# Patient Record
Sex: Female | Born: 1985
Health system: Southern US, Community
[De-identification: ages and names within clinical notes are randomized; demographics above are authoritative.]

## PROBLEM LIST (undated history)

## (undated) DIAGNOSIS — E039 Hypothyroidism, unspecified: Secondary | ICD-10-CM

## (undated) DIAGNOSIS — R7989 Other specified abnormal findings of blood chemistry: Secondary | ICD-10-CM

## (undated) HISTORY — PX: DILATION AND CURETTAGE OF UTERUS: SHX78

## (undated) HISTORY — DX: Other specified abnormal findings of blood chemistry: R79.89

## (undated) HISTORY — PX: WISDOM TOOTH EXTRACTION: SHX21

---
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
## 2009-07-27 LAB — HM PAP SMEAR

## 2011-12-31 ENCOUNTER — Ambulatory Visit (INDEPENDENT_AMBULATORY_CARE_PROVIDER_SITE_OTHER): Payer: 59 | Admitting: Family Medicine

## 2011-12-31 ENCOUNTER — Encounter: Payer: Self-pay | Admitting: Family Medicine

## 2011-12-31 VITALS — BP 132/72 | HR 67 | Temp 98.3°F | Ht 67.5 in | Wt 154.0 lb

## 2011-12-31 DIAGNOSIS — R6889 Other general symptoms and signs: Secondary | ICD-10-CM

## 2011-12-31 DIAGNOSIS — R7989 Other specified abnormal findings of blood chemistry: Secondary | ICD-10-CM

## 2011-12-31 DIAGNOSIS — K644 Residual hemorrhoidal skin tags: Secondary | ICD-10-CM

## 2011-12-31 LAB — TSH: TSH: 5.09 u[IU]/mL (ref 0.35–5.50)

## 2011-12-31 LAB — T4, FREE: Free T4: 0.82 ng/dL (ref 0.60–1.60)

## 2011-12-31 NOTE — Progress Notes (Signed)
New pt to est care.    Abnormal TSH on employment labs with no neck mass, dysphagia, h/o thyroid disease.  No meds.   Rectal bleed.  Occ and intermittent, bright red, small amount.  With painful BM.  Last episode about 2 weeks ago.  No personal or FH of GI cancer, UC, crohn's.  No NAV, no diarrhea, no weight loss, no rash.  Feeling well o/w.   PMH and SH reviewed  ROS: See HPI, otherwise noncontributory.  Meds, vitals, and allergies reviewed.  GEN: nad, alert and oriented HEENT: mucous membranes moist NECK: supple w/o LA, no goiter CV: rrr.  no murmur PULM: ctab, no inc wob ABD: soft, +bs EXT: no edema SKIN: no acute rash Small old ext hemorrhoid but no fissure noted, no gross blood.  Chaperoned exam.

## 2011-12-31 NOTE — Patient Instructions (Addendum)
Think about either setting up with gynecology or coming back for a pap smear at this clinic.   You can get your results through our phone system.  Follow the instructions on the blue card. I would work on increasing the fiber in your diet.  If you continue to have bleeding, call back and we'll set you up with GI.   Take care.

## 2012-01-02 ENCOUNTER — Encounter: Payer: Self-pay | Admitting: Family Medicine

## 2012-01-02 DIAGNOSIS — K644 Residual hemorrhoidal skin tags: Secondary | ICD-10-CM | POA: Insufficient documentation

## 2012-01-02 DIAGNOSIS — R7989 Other specified abnormal findings of blood chemistry: Secondary | ICD-10-CM | POA: Insufficient documentation

## 2012-01-02 NOTE — Assessment & Plan Note (Signed)
With repeat TSH on high end of normal 1/13, FT4 wnl, consider recheck yearly.

## 2012-01-02 NOTE — Assessment & Plan Note (Signed)
D/w pt.  No other obvious source.  She could have had a healed fissure.  Inc fiber and if persistent, we can refer over to GI. She agrees.

## 2012-01-04 ENCOUNTER — Encounter: Payer: Self-pay | Admitting: Family Medicine

## 2013-06-05 ENCOUNTER — Telehealth: Payer: Self-pay | Admitting: Family Medicine

## 2013-06-05 NOTE — Telephone Encounter (Signed)
Pt needs a CPE and an employment form signed prior to 07/04/2013 (she just rec'd it yesterday).  Due to her tight work schedule she is only available for CPE on 06/21/2013; 06/25/2013; or 06/28/2013.  However, your first available CPE appmt is not until the end of July.  Can you accommodate her an apptmt for CPE on one of the days she has requested? Thank you.

## 2013-06-05 NOTE — Telephone Encounter (Signed)
Scheduled for 06/28/2013

## 2013-06-05 NOTE — Telephone Encounter (Signed)
See if you can add her on either 6/26 or 7/3.  Thanks.

## 2013-06-28 ENCOUNTER — Ambulatory Visit (INDEPENDENT_AMBULATORY_CARE_PROVIDER_SITE_OTHER): Payer: 59 | Admitting: Family Medicine

## 2013-06-28 ENCOUNTER — Encounter: Payer: Self-pay | Admitting: Family Medicine

## 2013-06-28 VITALS — BP 104/64 | HR 72 | Temp 98.2°F | Ht 67.0 in | Wt 169.0 lb

## 2013-06-28 DIAGNOSIS — Z021 Encounter for pre-employment examination: Secondary | ICD-10-CM

## 2013-06-28 DIAGNOSIS — R7989 Other specified abnormal findings of blood chemistry: Secondary | ICD-10-CM

## 2013-06-28 DIAGNOSIS — Z0289 Encounter for other administrative examinations: Secondary | ICD-10-CM

## 2013-06-28 DIAGNOSIS — R6889 Other general symptoms and signs: Secondary | ICD-10-CM

## 2013-06-28 LAB — T4, FREE: Free T4: 0.79 ng/dL (ref 0.60–1.60)

## 2013-06-28 LAB — TSH: TSH: 3.4 u[IU]/mL (ref 0.35–5.50)

## 2013-06-28 NOTE — Patient Instructions (Addendum)
Go to the lab on the way out.  We'll contact you with your lab report. Take care.  Glad to see you.  

## 2013-06-28 NOTE — Progress Notes (Signed)
H/o mildly elevated TSH.  Discussed. No sx.  Due for recheck.    Recently with pap per gyn.  Tdap up to date.   Feels well.  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  See scanned forms.  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    Meds, vitals, and allergies reviewed.   ROS: See HPI.  Otherwise, noncontributory.  GEN: nad, alert and oriented HEENT: mucous membranes moist NECK: supple w/o LA, no TMG CV: rrr. PULM: ctab, no inc wob ABD: soft, +bs EXT: no edema SKIN: no acute rash

## 2013-07-01 DIAGNOSIS — Z021 Encounter for pre-employment examination: Secondary | ICD-10-CM | POA: Insufficient documentation

## 2013-07-01 NOTE — Assessment & Plan Note (Signed)
Recheck wnl, would likely not need f/u unless sx are present or pt has concerns.

## 2013-07-01 NOTE — Assessment & Plan Note (Signed)
No contraindication to work, see scanned forms.  Copy given to patient.

## 2013-07-30 ENCOUNTER — Encounter: Payer: Self-pay | Admitting: Family Medicine

## 2013-11-01 ENCOUNTER — Other Ambulatory Visit: Payer: Self-pay

## 2015-04-03 ENCOUNTER — Encounter: Payer: 59 | Admitting: Family Medicine

## 2015-04-07 ENCOUNTER — Telehealth: Payer: Self-pay | Admitting: Family Medicine

## 2015-04-07 NOTE — Telephone Encounter (Signed)
Scheduled for 4/20

## 2015-04-07 NOTE — Telephone Encounter (Signed)
Please, yes, thanks.  

## 2015-04-07 NOTE — Telephone Encounter (Signed)
Pt states she needs a tdap vaccination for school. Is this ok to schedule? Pts phone number is [phone]. Thanks.

## 2015-04-16 ENCOUNTER — Ambulatory Visit (INDEPENDENT_AMBULATORY_CARE_PROVIDER_SITE_OTHER): Payer: No Typology Code available for payment source | Admitting: *Deleted

## 2015-04-16 DIAGNOSIS — Z23 Encounter for immunization: Secondary | ICD-10-CM | POA: Diagnosis not present

## 2015-11-18 NOTE — Telephone Encounter (Signed)
Error

## 2016-09-13 DIAGNOSIS — Z23 Encounter for immunization: Secondary | ICD-10-CM | POA: Diagnosis not present

## 2017-02-14 DIAGNOSIS — Z01419 Encounter for gynecological examination (general) (routine) without abnormal findings: Secondary | ICD-10-CM | POA: Diagnosis not present

## 2017-02-14 DIAGNOSIS — Z6824 Body mass index (BMI) 24.0-24.9, adult: Secondary | ICD-10-CM | POA: Diagnosis not present

## 2017-03-16 DIAGNOSIS — D2271 Melanocytic nevi of right lower limb, including hip: Secondary | ICD-10-CM | POA: Diagnosis not present

## 2017-03-16 DIAGNOSIS — D2371 Other benign neoplasm of skin of right lower limb, including hip: Secondary | ICD-10-CM | POA: Diagnosis not present

## 2017-03-16 DIAGNOSIS — D485 Neoplasm of uncertain behavior of skin: Secondary | ICD-10-CM | POA: Diagnosis not present

## 2017-03-16 DIAGNOSIS — D225 Melanocytic nevi of trunk: Secondary | ICD-10-CM | POA: Diagnosis not present

## 2017-03-16 DIAGNOSIS — D2261 Melanocytic nevi of right upper limb, including shoulder: Secondary | ICD-10-CM | POA: Diagnosis not present

## 2018-04-10 ENCOUNTER — Encounter: Payer: Self-pay | Admitting: Family Medicine

## 2018-04-10 DIAGNOSIS — Z01419 Encounter for gynecological examination (general) (routine) without abnormal findings: Secondary | ICD-10-CM | POA: Diagnosis not present

## 2018-04-10 DIAGNOSIS — Z6824 Body mass index (BMI) 24.0-24.9, adult: Secondary | ICD-10-CM | POA: Diagnosis not present

## 2018-07-12 ENCOUNTER — Telehealth: Payer: Self-pay | Admitting: Family Medicine

## 2018-07-12 NOTE — Telephone Encounter (Signed)
Copied from [place] [id number]. Topic: Quick Communication - See Telephone Encounter >> Jul 12, 2018  2:39 PM Mylinda Latina, NT wrote: CRM for notification. See Telephone encounter for: 07/12/18. Patient called and states she is going out of the country and needs a Typhoid and malaria medication( for prevention )  and also she needs the Hep A vaccination. Please call if this need to be an appt or if this can be done. CB# [phone]

## 2018-07-12 NOTE — Telephone Encounter (Signed)
Pt had not been seen since 06/28/13; pt will need to reesstablish per Jeani Hawking with first 30' appt. Pt can only come week of 07/17/18. Pt plans to travel in 08/2018. Pt will bring immunization record with her. Pt scheduled 30' appt on 07/21/18 at 2:15. FYI to Dr Damita Dunnings.

## 2018-07-14 NOTE — Telephone Encounter (Signed)
Noted. Thanks.

## 2018-07-21 ENCOUNTER — Encounter: Payer: Self-pay | Admitting: Family Medicine

## 2018-07-21 ENCOUNTER — Ambulatory Visit (INDEPENDENT_AMBULATORY_CARE_PROVIDER_SITE_OTHER): Payer: BLUE CROSS/BLUE SHIELD | Admitting: Family Medicine

## 2018-07-21 VITALS — BP 108/68 | HR 68 | Temp 98.0°F | Ht 66.0 in | Wt 149.5 lb

## 2018-07-21 DIAGNOSIS — Z7189 Other specified counseling: Secondary | ICD-10-CM

## 2018-07-21 DIAGNOSIS — Z23 Encounter for immunization: Secondary | ICD-10-CM | POA: Diagnosis not present

## 2018-07-21 DIAGNOSIS — Z7184 Encounter for health counseling related to travel: Secondary | ICD-10-CM

## 2018-07-21 MED ORDER — TYPHOID VACCINE PO CPDR: 1.0000 | DELAYED_RELEASE_CAPSULE | ORAL | 0 refills | Status: DC

## 2018-07-21 MED ORDER — DOXYCYCLINE HYCLATE 100 MG PO TABS: ORAL_TABLET | ORAL | 0 refills | Status: DC

## 2018-07-21 NOTE — Progress Notes (Signed)
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   D/w pt.  She'll be working at [employer].    Husband Legrand Como designated if patient were incapacitated.    Pap done at [facility] 2019.   Requesting old records.    HIV screening prev done ~2008 and ~2014.    She is travelling to [place], [place] and [place].  Discussed with patient about routine travel cautions.  CDC information reviewed about destination specific cautions.  Reasonable to get hepatitis A vaccine started.  Typhoid vaccine is reasonable to do.  Malaria prophylaxis discussed.  Doxycycline is a reasonable options.  Routine cautions given.  Routine instructions given.  PMH and SH reviewed  ROS: Per HPI unless specifically indicated in ROS section   Meds, vitals, and allergies reviewed.   GEN: nad, alert and oriented HEENT: mucous membranes moist NECK: supple w/o LA CV: rrr. PULM: ctab, no inc wob ABD: soft, +bs EXT: no edema SKIN: no acute rash

## 2018-07-21 NOTE — Patient Instructions (Signed)
Go ahead and take typhoid vaccine.  Doxy prior to, during, and after the trip.  Sun caution.  HAV vaccine today. 2nd dose in 6 months.  Call for a nurse visit.  Take care.  Glad to see you.

## 2018-07-23 DIAGNOSIS — Z7189 Other specified counseling: Secondary | ICD-10-CM | POA: Insufficient documentation

## 2018-07-23 DIAGNOSIS — Z7184 Encounter for health counseling related to travel: Secondary | ICD-10-CM | POA: Insufficient documentation

## 2018-07-23 NOTE — Assessment & Plan Note (Signed)
Husband Legrand Como designated if patient were incapacitated.

## 2018-07-23 NOTE — Assessment & Plan Note (Addendum)
She is travelling to [place], [place] and [place].  Discussed with patient about routine travel cautions.  CDC information reviewed about destination specific cautions.  Reasonable to get hepatitis A vaccine started.  Typhoid vaccine is reasonable to do.  Malaria prophylaxis discussed.  Doxycycline is a reasonable options.  Routine cautions given.  Routine instructions given.  Return for second dose of hepatitis A vaccine in 6 months. >30 minutes spent in face to face time with patient, >50% spent in counselling or coordination of care.

## 2018-08-20 ENCOUNTER — Other Ambulatory Visit: Payer: Self-pay | Admitting: Family Medicine

## 2018-08-21 NOTE — Telephone Encounter (Signed)
Electronic refill request. Doxycycline Last office visit:   07/21/18 Last Filled:    50 tablet 0 07/21/2018  Please advise.

## 2018-08-22 NOTE — Telephone Encounter (Signed)
Spoke with grandmother, patient not available, who will ask patient to call the office.

## 2018-08-22 NOTE — Telephone Encounter (Signed)
Likely an autorefill request.  Please see what the issue is.  I didn't send yet.  Thanks.

## 2018-08-23 ENCOUNTER — Encounter: Payer: Self-pay | Admitting: Family Medicine

## 2018-08-23 NOTE — Telephone Encounter (Signed)
Spoke to patient and was advised that she did not request the refill because she does not need it.

## 2018-08-23 NOTE — Telephone Encounter (Signed)
Noted. Thanks.

## 2019-01-19 DIAGNOSIS — D225 Melanocytic nevi of trunk: Secondary | ICD-10-CM | POA: Diagnosis not present

## 2019-01-19 DIAGNOSIS — L905 Scar conditions and fibrosis of skin: Secondary | ICD-10-CM | POA: Diagnosis not present

## 2019-01-19 DIAGNOSIS — D485 Neoplasm of uncertain behavior of skin: Secondary | ICD-10-CM | POA: Diagnosis not present

## 2019-01-19 DIAGNOSIS — D2271 Melanocytic nevi of right lower limb, including hip: Secondary | ICD-10-CM | POA: Diagnosis not present

## 2019-01-19 DIAGNOSIS — D2239 Melanocytic nevi of other parts of face: Secondary | ICD-10-CM | POA: Diagnosis not present

## 2019-01-19 DIAGNOSIS — D2261 Melanocytic nevi of right upper limb, including shoulder: Secondary | ICD-10-CM | POA: Diagnosis not present

## 2019-03-06 DIAGNOSIS — D485 Neoplasm of uncertain behavior of skin: Secondary | ICD-10-CM | POA: Diagnosis not present

## 2019-03-06 DIAGNOSIS — L905 Scar conditions and fibrosis of skin: Secondary | ICD-10-CM | POA: Diagnosis not present

## 2019-04-03 DIAGNOSIS — M9903 Segmental and somatic dysfunction of lumbar region: Secondary | ICD-10-CM | POA: Diagnosis not present

## 2019-04-03 DIAGNOSIS — M5386 Other specified dorsopathies, lumbar region: Secondary | ICD-10-CM | POA: Diagnosis not present

## 2019-04-03 DIAGNOSIS — M9902 Segmental and somatic dysfunction of thoracic region: Secondary | ICD-10-CM | POA: Diagnosis not present

## 2019-04-03 DIAGNOSIS — M9905 Segmental and somatic dysfunction of pelvic region: Secondary | ICD-10-CM | POA: Diagnosis not present

## 2019-04-10 DIAGNOSIS — M5386 Other specified dorsopathies, lumbar region: Secondary | ICD-10-CM | POA: Diagnosis not present

## 2019-04-10 DIAGNOSIS — M9905 Segmental and somatic dysfunction of pelvic region: Secondary | ICD-10-CM | POA: Diagnosis not present

## 2019-04-10 DIAGNOSIS — M9902 Segmental and somatic dysfunction of thoracic region: Secondary | ICD-10-CM | POA: Diagnosis not present

## 2019-04-10 DIAGNOSIS — M9903 Segmental and somatic dysfunction of lumbar region: Secondary | ICD-10-CM | POA: Diagnosis not present

## 2019-04-17 DIAGNOSIS — M9903 Segmental and somatic dysfunction of lumbar region: Secondary | ICD-10-CM | POA: Diagnosis not present

## 2019-04-17 DIAGNOSIS — M5386 Other specified dorsopathies, lumbar region: Secondary | ICD-10-CM | POA: Diagnosis not present

## 2019-04-17 DIAGNOSIS — M9902 Segmental and somatic dysfunction of thoracic region: Secondary | ICD-10-CM | POA: Diagnosis not present

## 2019-04-17 DIAGNOSIS — M9905 Segmental and somatic dysfunction of pelvic region: Secondary | ICD-10-CM | POA: Diagnosis not present

## 2019-04-24 DIAGNOSIS — M9905 Segmental and somatic dysfunction of pelvic region: Secondary | ICD-10-CM | POA: Diagnosis not present

## 2019-04-24 DIAGNOSIS — M9903 Segmental and somatic dysfunction of lumbar region: Secondary | ICD-10-CM | POA: Diagnosis not present

## 2019-04-24 DIAGNOSIS — M9902 Segmental and somatic dysfunction of thoracic region: Secondary | ICD-10-CM | POA: Diagnosis not present

## 2019-04-24 DIAGNOSIS — M5386 Other specified dorsopathies, lumbar region: Secondary | ICD-10-CM | POA: Diagnosis not present

## 2019-05-01 DIAGNOSIS — M5386 Other specified dorsopathies, lumbar region: Secondary | ICD-10-CM | POA: Diagnosis not present

## 2019-05-01 DIAGNOSIS — M9905 Segmental and somatic dysfunction of pelvic region: Secondary | ICD-10-CM | POA: Diagnosis not present

## 2019-05-01 DIAGNOSIS — M9903 Segmental and somatic dysfunction of lumbar region: Secondary | ICD-10-CM | POA: Diagnosis not present

## 2019-05-01 DIAGNOSIS — M9902 Segmental and somatic dysfunction of thoracic region: Secondary | ICD-10-CM | POA: Diagnosis not present

## 2019-05-07 DIAGNOSIS — M9903 Segmental and somatic dysfunction of lumbar region: Secondary | ICD-10-CM | POA: Diagnosis not present

## 2019-05-07 DIAGNOSIS — M5386 Other specified dorsopathies, lumbar region: Secondary | ICD-10-CM | POA: Diagnosis not present

## 2019-05-07 DIAGNOSIS — M9905 Segmental and somatic dysfunction of pelvic region: Secondary | ICD-10-CM | POA: Diagnosis not present

## 2019-05-07 DIAGNOSIS — M9902 Segmental and somatic dysfunction of thoracic region: Secondary | ICD-10-CM | POA: Diagnosis not present

## 2019-05-23 DIAGNOSIS — Z03818 Encounter for observation for suspected exposure to other biological agents ruled out: Secondary | ICD-10-CM | POA: Diagnosis not present

## 2019-06-07 DIAGNOSIS — Z319 Encounter for procreative management, unspecified: Secondary | ICD-10-CM | POA: Diagnosis not present

## 2019-06-07 DIAGNOSIS — Z6824 Body mass index (BMI) 24.0-24.9, adult: Secondary | ICD-10-CM | POA: Diagnosis not present

## 2019-06-07 DIAGNOSIS — Z01419 Encounter for gynecological examination (general) (routine) without abnormal findings: Secondary | ICD-10-CM | POA: Diagnosis not present

## 2019-08-01 DIAGNOSIS — D485 Neoplasm of uncertain behavior of skin: Secondary | ICD-10-CM | POA: Diagnosis not present

## 2019-08-01 DIAGNOSIS — D225 Melanocytic nevi of trunk: Secondary | ICD-10-CM | POA: Diagnosis not present

## 2019-08-01 DIAGNOSIS — D235 Other benign neoplasm of skin of trunk: Secondary | ICD-10-CM | POA: Diagnosis not present

## 2019-08-17 DIAGNOSIS — N911 Secondary amenorrhea: Secondary | ICD-10-CM | POA: Diagnosis not present

## 2019-08-17 DIAGNOSIS — Z3201 Encounter for pregnancy test, result positive: Secondary | ICD-10-CM | POA: Diagnosis not present

## 2019-08-17 DIAGNOSIS — Z368A Encounter for antenatal screening for other genetic defects: Secondary | ICD-10-CM | POA: Diagnosis not present

## 2019-08-28 DIAGNOSIS — O021 Missed abortion: Secondary | ICD-10-CM | POA: Diagnosis not present

## 2019-08-28 DIAGNOSIS — Z3A08 8 weeks gestation of pregnancy: Secondary | ICD-10-CM | POA: Diagnosis not present

## 2019-08-28 DIAGNOSIS — O26891 Other specified pregnancy related conditions, first trimester: Secondary | ICD-10-CM | POA: Diagnosis not present

## 2019-09-06 DIAGNOSIS — O021 Missed abortion: Secondary | ICD-10-CM | POA: Diagnosis not present

## 2019-09-06 DIAGNOSIS — O039 Complete or unspecified spontaneous abortion without complication: Secondary | ICD-10-CM | POA: Diagnosis not present

## 2020-03-27 ENCOUNTER — Telehealth: Payer: Self-pay | Admitting: Radiology

## 2020-03-27 ENCOUNTER — Ambulatory Visit (INDEPENDENT_AMBULATORY_CARE_PROVIDER_SITE_OTHER): Payer: BC Managed Care – PPO | Admitting: Internal Medicine

## 2020-03-27 ENCOUNTER — Ambulatory Visit (HOSPITAL_COMMUNITY)
Admission: RE | Admit: 2020-03-27 | Discharge: 2020-03-27 | Disposition: A | Discharge status: Home / Self Care | Payer: BC Managed Care – PPO | Source: Ambulatory Visit | Attending: Internal Medicine | Admitting: Internal Medicine

## 2020-03-27 ENCOUNTER — Encounter: Payer: Self-pay | Admitting: Internal Medicine

## 2020-03-27 ENCOUNTER — Other Ambulatory Visit: Payer: Self-pay

## 2020-03-27 ENCOUNTER — Other Ambulatory Visit (HOSPITAL_COMMUNITY): Payer: Self-pay | Admitting: *Deleted

## 2020-03-27 DIAGNOSIS — I493 Ventricular premature depolarization: Secondary | ICD-10-CM | POA: Diagnosis not present

## 2020-03-27 DIAGNOSIS — R002 Palpitations: Secondary | ICD-10-CM

## 2020-03-27 DIAGNOSIS — I340 Nonrheumatic mitral (valve) insufficiency: Secondary | ICD-10-CM | POA: Insufficient documentation

## 2020-03-27 MED ORDER — METOPROLOL SUCCINATE ER 25 MG PO TB24: 25.0000 mg | ORAL_TABLET | Freq: Every day | ORAL | 11 refills | Status: DC

## 2020-03-27 NOTE — Progress Notes (Signed)
HPI [name] is referred today by Sharyne Peach for evaluation of PVC's. 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 She has had 5 days of intermittent palpitations. She has not had syncope. She denies chest pain or sob. She consumes 2 ETOH drinks though not daily and 2 cups of coffee a day. She has had no edema. 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   Not on File   Current Outpatient Medications  Medication Sig Dispense Refill  . metoprolol succinate (TOPROL XL) 25 MG 24 hr tablet Take 1 tablet (25 mg total) by mouth daily. 30 tablet 11   No current facility-administered medications for this visit.     History reviewed. No pertinent past medical history.  ROS:   All systems reviewed and negative except as noted in the HPI.   History reviewed. No pertinent surgical history.   History reviewed. No pertinent family history.   Social History   Socioeconomic History  . Marital status: Married    Spouse name: Not on file  . Number of children: Not on file  . Years of education: Not on file  . Highest education level: Not on file  Occupational History  . Not on file  Tobacco Use  . Smoking status: Not on file  Substance and Sexual Activity  . Alcohol use: Not on file  . Drug use: Not on file  . Sexual activity: Not on file  Other Topics Concern  . Not on file  Social History Narrative  . Not on file   Social Determinants of Health   Financial Resource Strain:   . Difficulty of Paying Living Expenses:   Food Insecurity:   . Worried About Charity fundraiser in the Last Year:   . Arboriculturist in the Last Year:   Transportation Needs:   . Film/video editor (Medical):   Marland Kitchen Lack of Transportation (Non-Medical):   Physical Activity:   . Days of Exercise per Week:   . Minutes of Exercise per Session:   Stress:   . Feeling of  Stress :   Social Connections:   . Frequency of Communication with Friends and Family:   . Frequency of Social Gatherings with Friends and Family:   . Attends Religious Services:   . Active Member of Clubs or Organizations:   . Attends Archivist Meetings:   Marland Kitchen Marital Status:   Intimate Partner Violence:   . Fear of Current or Ex-Partner:   . Emotionally Abused:   Marland Kitchen Physically Abused:   . Sexually Abused:      BP 108/64   Pulse 62   Physical Exam:  Well appearing 34 yo woman, NAD HEENT: Unremarkable Neck:  No 6 cm no thyromegally Lymphatics:  No adenopathy Back:  No CVA tenderness Lungs:  Clear with no wheezes HEART:  IRegular rate rhythm, no murmurs, no rubs, no clicks Abd:  soft, positive bowel sounds, no organomegally, no rebound, no guarding Ext:  2 plus pulses, no edema, no cyanosis, no clubbing Skin:  No rashes no nodules Neuro:  CN II through XII intact, motor grossly intact  EKG - nsr with occaisional PVC's, monomorphic  Assess/Plan: 1. 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 I have discussed the treatment options with the patient.  I have encouraged her to reduce her caffeine/ETOH intake. She has had a 2D echo which is pending and listed under her maiden name Melody Gibbs. I have recommended she undergo a 3 day Zio patch monitor to determine her overall burden and start taking toprol 25 mg daily. Additonal medical therapy and evaluation will depend on the results of the 2D echo and Zio patch monitor.   Melody Gibbs.D.

## 2020-03-27 NOTE — Progress Notes (Signed)
  Echocardiogram 2D Echocardiogram has been performed.  Melody Gibbs 03/27/2020, 2:40 PM

## 2020-03-27 NOTE — Progress Notes (Signed)
EKG and echo order placed per Dr Haroldine Laws, test sch for 2 pm today then pt will see Dr Lovena Le at [facility] afterwards. Pt is aware

## 2020-03-27 NOTE — Telephone Encounter (Signed)
Enrolled patient for a 3 day Zio monitor to be mailed to patients home.  

## 2020-03-27 NOTE — Addendum Note (Signed)
Addended by: Scarlette Calico on: 03/27/2020 12:08 PM   Modules accepted: Orders

## 2020-03-27 NOTE — Addendum Note (Signed)
Addended by: Scarlette Calico on: 03/27/2020 01:34 PM   Modules accepted: Orders

## 2020-03-27 NOTE — Patient Instructions (Addendum)
Medication Instructions:  Your physician has recommended you make the following change in your medication:   1.  Start taking Toprol XL 25 mg-  Take one tablet by mouth daily   Labwork: None ordered.  Testing/Procedures: None ordered.  Follow-Up:  Any Other Special Instructions Will Be Listed Below (If Applicable).  If you need a refill on your cardiac medications before your next appointment, please call your pharmacy.   ZIO XT- Long Term Monitor Instructions   Your physician has requested you wear your ZIO patch monitor_3___days.   This is a single patch monitor.  [employer] supplies one patch monitor per enrollment.  Additional stickers are not available.   Please do not apply patch if you will be having a Nuclear Stress Test, Echocardiogram, Cardiac CT, MRI, or Chest Xray during the time frame you would be wearing the monitor. The patch cannot be worn during these tests.  You cannot remove and re-apply the ZIO XT patch monitor.   Your ZIO patch monitor will be sent USPS Priority mail from [facility] directly to your home address. The monitor may also be mailed to a PO BOX if home delivery is not available.   It may take 3-5 days to receive your monitor after you have been enrolled.   Once you have received you monitor, please review enclosed instructions.  Your monitor has already been registered assigning a specific monitor serial # to you.   Applying the monitor   Shave hair from upper left chest.   Hold abrader disc by orange tab.  Rub abrader in 40 strokes over left upper chest as indicated in your monitor instructions.   Clean area with 4 enclosed alcohol pads .  Use all pads to assure are is cleaned thoroughly.  Let dry.   Apply patch as indicated in monitor instructions.  Patch will be place under collarbone on left side of chest with arrow pointing upward.   Rub patch adhesive wings for 2 minutes.Remove white label marked "1".  Remove white label marked "2".   Rub patch adhesive wings for 2 additional minutes.   While looking in a mirror, press and release button in center of patch.  A small green light will flash 3-4 times .  This will be your only indicator the monitor has been turned on.     Do not shower for the first 24 hours.  You may shower after the first 24 hours.   Press button if you feel a symptom. You will hear a small click.  Record Date, Time and Symptom in the Patient Log Book.   When you are ready to remove patch, follow instructions on last 2 pages of Patient Log Book.  Stick patch monitor onto last page of Patient Log Book.   Place Patient Log Book in [place] box.  Use locking tab on box and tape box closed securely.  The [employer] and [employer] has [employer] on it.  Please place in mailbox as soon as possible.  Your physician should have your test results approximately 7 days after the monitor has been mailed back to [facility].   Call [facility] at [phone] if you have questions regarding your ZIO XT patch monitor.  Call them immediately if you see an orange light blinking on your monitor.   If your monitor falls off in less than 4 days contact our Monitor department at [phone].  If your monitor becomes loose or falls off after 4 days call [employer] at  [phone] for suggestions on securing your monitor.

## 2020-03-28 ENCOUNTER — Encounter: Payer: Self-pay | Admitting: Family Medicine

## 2020-03-28 ENCOUNTER — Telehealth: Payer: Self-pay

## 2020-03-28 DIAGNOSIS — I493 Ventricular premature depolarization: Secondary | ICD-10-CM

## 2020-03-28 NOTE — Telephone Encounter (Addendum)
Order placed for lab work per Dr. Lovena Le BMP, mag and TSH

## 2020-03-28 NOTE — Addendum Note (Signed)
Addended by: Willeen Cass A on: 03/28/2020 03:04 PM   Modules accepted: Orders

## 2020-03-31 ENCOUNTER — Other Ambulatory Visit (INDEPENDENT_AMBULATORY_CARE_PROVIDER_SITE_OTHER): Payer: BC Managed Care – PPO

## 2020-03-31 DIAGNOSIS — R002 Palpitations: Secondary | ICD-10-CM | POA: Diagnosis not present

## 2020-03-31 DIAGNOSIS — I493 Ventricular premature depolarization: Secondary | ICD-10-CM

## 2020-04-01 ENCOUNTER — Other Ambulatory Visit: Payer: BC Managed Care – PPO

## 2020-04-01 ENCOUNTER — Other Ambulatory Visit: Payer: Self-pay

## 2020-04-01 DIAGNOSIS — I493 Ventricular premature depolarization: Secondary | ICD-10-CM

## 2020-04-02 ENCOUNTER — Ambulatory Visit: Payer: BC Managed Care – PPO | Admitting: Cardiovascular Disease

## 2020-04-02 ENCOUNTER — Ambulatory Visit: Payer: BLUE CROSS/BLUE SHIELD | Admitting: Cardiovascular Disease

## 2020-04-02 LAB — BASIC METABOLIC PANEL WITH GFR
BUN/Creatinine Ratio: 17 (ref 9–23)
BUN: 13 mg/dL (ref 6–20)
CO2: 23 mmol/L (ref 20–29)
Calcium: 9.2 mg/dL (ref 8.7–10.2)
Chloride: 103 mmol/L (ref 96–106)
Creatinine, Ser: 0.75 mg/dL (ref 0.57–1.00)
GFR calc Af Amer: 122 mL/min/1.73
GFR calc non Af Amer: 106 mL/min/1.73
Glucose: 66 mg/dL (ref 65–99)
Potassium: 4.3 mmol/L (ref 3.5–5.2)
Sodium: 141 mmol/L (ref 134–144)

## 2020-04-02 LAB — MAGNESIUM: Magnesium: 2.1 mg/dL (ref 1.6–2.3)

## 2020-04-02 LAB — TSH: TSH: 4.38 u[IU]/mL (ref 0.450–4.500)

## 2020-07-09 ENCOUNTER — Ambulatory Visit (INDEPENDENT_AMBULATORY_CARE_PROVIDER_SITE_OTHER): Payer: BC Managed Care – PPO | Admitting: Family Medicine

## 2020-07-09 ENCOUNTER — Other Ambulatory Visit: Payer: Self-pay

## 2020-07-09 DIAGNOSIS — M999 Biomechanical lesion, unspecified: Secondary | ICD-10-CM | POA: Diagnosis not present

## 2020-07-09 DIAGNOSIS — M533 Sacrococcygeal disorders, not elsewhere classified: Secondary | ICD-10-CM

## 2020-07-09 MED ORDER — TIZANIDINE HCL 4 MG PO TABS: 4.0000 mg | ORAL_TABLET | Freq: Every day | ORAL | 0 refills | Status: DC

## 2020-07-09 MED ORDER — MELOXICAM 15 MG PO TABS: 15.0000 mg | ORAL_TABLET | Freq: Every day | ORAL | 0 refills | Status: DC

## 2020-07-09 NOTE — Patient Instructions (Addendum)
Exercises 3x a week Meloxicam Zanaflex See me again in 5 weeks

## 2020-07-09 NOTE — Progress Notes (Signed)
[facility] [place] Phone: [phone] Subjective:   Melody Gibbs, am serving as a scribe for Dr. Hulan Saas. This visit occurred during the SARS-CoV-2 public health emergency.  Safety protocols were in place, including screening questions prior to the visit, additional usage of staff PPE, and extensive cleaning of exam room while observing appropriate contact time as indicated for disinfecting solutions.   I'm seeing this patient by the request  of:  Tonia Ghent, MD  CC: Low back pain  AST:[id number]  Melody Gibbs is a 34 y.o. female coming in with complaint of lower back pain for 10 years. Is active and certain exercises bother her back ie v-ups, kettle bell swings. Can on occasion have heaviness in her right leg. On bad days she will rotate Tylenol and IBU.  Patient was a gymnast at one point.  Patient has remained very active.  Feels though that she has more fallout from aggressive working out now than she used to.  Denies any fevers chills or any abnormal weight loss     Past Medical History:  Diagnosis Date   Abnormal TSH    5.9 in 2012   Gibbs past surgical history on file. 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 Frequent exercise Enjoys photography Married 2019    Social Determinants of  Health   Financial Resource Strain:    Difficulty of Paying Living Expenses:   Food Insecurity:    Worried About Charity fundraiser in the Last Year:    Arboriculturist in the Last Year:   Transportation Needs:    Film/video editor (Medical):    Lack of Transportation (Non-Medical):   Physical Activity:    Days of Exercise per Week:    Minutes of Exercise per Session:   Stress:    Feeling of Stress :   Social Connections:    Frequency of Communication with Friends and Family:    Frequency of Social Gatherings with Friends and Family:    Attends Religious Services:    Active Member of Clubs or Organizations:    Attends Music therapist:    Marital Status:    Allergies  Allergen Reactions   Penicillins     As a child   Family History  Problem Relation Age of Onset   Hypertension Father    Hypertension Other    Hyperlipidemia Other    Arthritis Other    Dementia Other    Colon cancer Neg Hx    Breast cancer Neg Hx      Current Outpatient Medications (Cardiovascular):    metoprolol succinate (TOPROL XL) 25 MG 24 hr tablet, Take 1 tablet (25 mg  total) by mouth daily.   Current Outpatient Medications (Analgesics):    meloxicam (MOBIC) 15 MG tablet, Take 1 tablet (15 mg total) by mouth daily.   Current Outpatient Medications (Other):    doxycycline (VIBRA-TABS) 100 MG tablet, Start 3 days prior to travel.  Take 1 day.  Sun caution.  Take for 4 weeks after trip.   typhoid (VIVOTIF) DR capsule, Take 1 capsule by mouth every other day.   tiZANidine (ZANAFLEX) 4 MG tablet, Take 1 tablet (4 mg total) by mouth at bedtime.   Reviewed prior external information including notes and imaging from  primary care provider As well as notes that were available from care everywhere and other healthcare systems.  Past medical history, social, surgical and family history all reviewed in electronic medical record.  Gibbs pertanent information  unless stated regarding to the chief complaint.   Review of Systems:  Gibbs headache, visual changes, nausea, vomiting, diarrhea, constipation, dizziness, abdominal pain, skin rash, fevers, chills, night sweats, weight loss, swollen lymph nodes, body aches, joint swelling, chest pain, shortness of breath, mood changes. POSITIVE muscle aches  Objective  Blood pressure 102/64, pulse 70, height 5\' 6"  (1.676 m), weight 155 lb (70.3 kg), SpO2 97 %.   General: Gibbs apparent distress alert and oriented x3 mood and affect normal, dressed appropriately.  HEENT: Pupils equal, extraocular movements intact  Respiratory: Patient's speak in full sentences and does not appear short of breath  Cardiovascular: Gibbs lower extremity edema, non tender, Gibbs erythema  Neuro: Cranial nerves II through XII are intact, neurovascularly intact in all extremities with 2+ DTRs and 2+ pulses.  Gait normal with good balance and coordination.  MSK: Low back exam shows the patient does have tenderness to palpation over the right sacroiliac joint.  Positive Corky Sox.  Tender to palpation.  Negative straight leg test.  Mild tightness noted of the right hamstring comparatively tightness also in the hip flexor on the right more than left  Osteopathic findings  T8 extended rotated and side bent right L3 flexed rotated and side bent right Sacrum right on right     Impression and Recommendations:     The above documentation has been reviewed and is accurate and complete Melody Pulley, DO       Note: This dictation was prepared with Dragon dictation along with smaller phrase technology. Any transcriptional errors that result from this process are unintentional.

## 2020-07-10 ENCOUNTER — Encounter: Payer: Self-pay | Admitting: Family Medicine

## 2020-07-10 DIAGNOSIS — M999 Biomechanical lesion, unspecified: Secondary | ICD-10-CM | POA: Insufficient documentation

## 2020-07-10 DIAGNOSIS — M533 Sacrococcygeal disorders, not elsewhere classified: Secondary | ICD-10-CM | POA: Insufficient documentation

## 2020-07-10 NOTE — Assessment & Plan Note (Signed)

## 2020-07-10 NOTE — Assessment & Plan Note (Signed)
Right-sided.  Seems to be significant.  Patient has had this intermittently for some time.  Discussed the potential for x-rays in the near future.  X-ray was down today so we will hold.  Zanaflex meloxicam given.  Responded well to manipulation.  Follow-up again 6 to 8 weeks

## 2020-08-06 ENCOUNTER — Other Ambulatory Visit: Payer: Self-pay | Admitting: Family Medicine

## 2020-08-19 ENCOUNTER — Ambulatory Visit: Payer: BC Managed Care – PPO | Admitting: Family Medicine

## 2020-08-21 ENCOUNTER — Other Ambulatory Visit: Payer: Self-pay | Admitting: Family Medicine

## 2020-08-25 NOTE — Progress Notes (Signed)
[facility] [address] [place] Phone: [phone] Subjective:   I Melody Gibbs am serving as a Education administrator for Dr. Hulan Saas.  This visit occurred during the SARS-CoV-2 public health emergency.  Safety protocols were in place, including screening questions prior to the visit, additional usage of staff PPE, and extensive cleaning of exam room while observing appropriate contact time as indicated for disinfecting solutions.   I'm seeing this patient by the request  of:  Tonia Ghent, MD  CC: back pain follow up   DVV:[id number]  Armanie Ullmer is a 34 y.o. female coming in with complaint of back pain. OMT 07/09/2020. Patient states she is doing well. Making progress well, lots of decrease in pain an dincrease activity, hit or miss on hep   Medications patient has been prescribed: 10 day of meloxicma very helpful   Taking: meloxicam and zanaflex          Reviewed prior external information including notes and imaging from previsou exam, outside providers and external EMR if available.   As well as notes that were available from care everywhere and other healthcare systems.  Past medical history, social, surgical and family history all reviewed in electronic medical record.  No pertanent information unless stated regarding to the chief complaint.   Past Medical History:  Diagnosis Date  . Abnormal TSH    5.9 in 2012    Allergies  Allergen Reactions  . Penicillins     As a child     Review of Systems:  No headache, visual changes, nausea, vomiting, diarrhea, constipation, dizziness, abdominal pain, skin rash, fevers, chills, night sweats, weight loss, swollen lymph nodes, joint swelling, chest pain, shortness of breath, mood changes. POSITIVE muscle aches, body aches   Objective  Blood pressure 100/70, pulse 78, height 5\' 6"  (1.676 m), weight 153 lb (69.4 kg), SpO2 98 %.   General: No apparent distress alert and oriented  x3 mood and affect normal, dressed appropriately.  HEENT: Pupils equal, extraocular movements intact  Respiratory: Patient's speak in full sentences and does not appear short of breath  Cardiovascular: No lower extremity edema, non tender, no erythema  Neuro: Cranial nerves II through XII are intact, neurovascularly intact in all extremities with 2+ DTRs and 2+ pulses.  Gait normal with good balance and coordination.  MSK:  Non tender with full range of motion and good stability and symmetric strength and tone of shoulders, elbows, wrist, hip, knee and ankles bilaterally.  Back - mild tightness in the TL and si joint right great er then left. + faber, TTP right SI joint, negSLT   Osteopathic findings  T6 E RS right  L1 flexed rotated and side bent right Sacrum right on right       Assessment and Plan:  SI (sacroiliac) joint dysfunction Improvement noted, HEP encouraged, discussed burst of meloxicam and zanaflex rtc in 2 month     Nonallopathic problems  Decision today to treat with OMT was based on Physical Exam  After verbal consent patient was treated with HVLA, ME, FPR techniques in cervical, rib, thoracic, lumbar, and sacral  areas  Patient tolerated the procedure well with improvement in symptoms  Patient given exercises, stretches and lifestyle modifications  See medications in patient instructions if given  Patient will follow up in 8 weeks      The above documentation has been reviewed and is accurate and complete Lyndal Pulley, DO  Note: This dictation was prepared with Dragon dictation along with smaller phrase technology. Any transcriptional errors that result from this process are unintentional.

## 2020-08-26 ENCOUNTER — Encounter: Payer: Self-pay | Admitting: Family Medicine

## 2020-08-26 ENCOUNTER — Ambulatory Visit (INDEPENDENT_AMBULATORY_CARE_PROVIDER_SITE_OTHER): Payer: BC Managed Care – PPO | Admitting: Family Medicine

## 2020-08-26 ENCOUNTER — Other Ambulatory Visit: Payer: Self-pay

## 2020-08-26 DIAGNOSIS — M999 Biomechanical lesion, unspecified: Secondary | ICD-10-CM

## 2020-08-26 DIAGNOSIS — M533 Sacrococcygeal disorders, not elsewhere classified: Secondary | ICD-10-CM

## 2020-08-26 NOTE — Patient Instructions (Addendum)
Good to see you Use muscle relaxer and meloxicam in burst when needed Vitamin D 2000 daily Keep up everything else  See me again in 2 months

## 2020-08-27 ENCOUNTER — Encounter: Payer: Self-pay | Admitting: Family Medicine

## 2020-08-27 NOTE — Assessment & Plan Note (Signed)
Improvement noted, HEP encouraged, discussed burst of meloxicam and zanaflex rtc in 2 month

## 2020-10-28 ENCOUNTER — Ambulatory Visit: Payer: BC Managed Care – PPO | Admitting: Family Medicine

## 2020-12-16 LAB — OB RESULTS CONSOLE HIV ANTIBODY (ROUTINE TESTING): HIV: NONREACTIVE

## 2020-12-16 LAB — OB RESULTS CONSOLE RUBELLA ANTIBODY, IGM: Rubella: IMMUNE

## 2020-12-16 LAB — OB RESULTS CONSOLE HEPATITIS B SURFACE ANTIGEN: Hepatitis B Surface Ag: NEGATIVE

## 2020-12-16 LAB — OB RESULTS CONSOLE RPR: RPR: NONREACTIVE

## 2020-12-16 LAB — OB RESULTS CONSOLE GBS: GBS: NEGATIVE

## 2020-12-16 LAB — OB RESULTS CONSOLE GC/CHLAMYDIA
Chlamydia: NEGATIVE
Gonorrhea: NEGATIVE

## 2020-12-16 LAB — OB RESULTS CONSOLE VARICELLA ZOSTER ANTIBODY, IGG: Varicella: IMMUNE

## 2020-12-19 ENCOUNTER — Other Ambulatory Visit: Payer: Self-pay

## 2020-12-29 ENCOUNTER — Encounter: Payer: Self-pay | Admitting: Genetic Counselor

## 2020-12-31 ENCOUNTER — Encounter: Payer: Self-pay | Admitting: Genetic Counselor

## 2021-01-22 ENCOUNTER — Telehealth: Payer: Self-pay

## 2021-01-22 NOTE — Telephone Encounter (Signed)
Patient's records have been indexed in her chart. Appointment is ready to be scheduled once she calls back.

## 2021-01-22 NOTE — Telephone Encounter (Signed)
Called patient, no answer, left message to return call to Maternal Fetal Medicine. Stated received referral from Dr. Cheri Fowler.

## 2021-01-26 ENCOUNTER — Other Ambulatory Visit: Payer: Self-pay

## 2021-01-26 ENCOUNTER — Ambulatory Visit
Payer: No Typology Code available for payment source | Attending: Obstetrics and Gynecology | Admitting: Genetic Counselor

## 2021-01-26 DIAGNOSIS — O352XX0 Maternal care for (suspected) hereditary disease in fetus, not applicable or unspecified: Secondary | ICD-10-CM

## 2021-01-26 DIAGNOSIS — Z31430 Encounter of female for testing for genetic disease carrier status for procreative management: Secondary | ICD-10-CM

## 2021-01-26 NOTE — Progress Notes (Signed)
I spoke with Melody Gibbs during a genetic counseling consultation with her husband, Melody Gibbs, on 12/29/20. Melody Gibbs was initially referred for genetic counseling to determine his carrier Gibbs for Melody Gibbs, as his father and sister are both known carriers. Given that Melody Gibbs is currently pregnant, we discussed the option of carrier screening for Melody Gibbs only versus Melody Gibbs plus additional conditions for a more comprehensive risk assessment for the current fetus. The following excerpt is copied from [place] note from 12/29/20:  "We reviewed that carrier screening for Melody Gibbs is recommended for Melody Gibbs. Melody Gibbs indicated that he would like to pursue carrier screening. We discussed that if he were found to be a carrier, carrier screening would be recommended for his wife in order to refine the chances of the couple having an affected fetus. We discussed that if Melody Gibbs is found to be a carrier and Melody Gibbs carrier screening is negative, the couple's children would all have a 50% chance of being unaffected carriers for Melody Gibbs.  Melody Gibbs expressed that he is content with only having carrier screening for Melody Gibbs performed. However, his wife is potentially interested in pursuing more comprehensive carrier screening for the sake of gaining more knowledge for the current and future pregnancies. We discussed that expanded carrier screening(ECS)isa testing option that evaluates an individual's carrier Gibbs fora wide range of genetic conditions. ECS panels can include>200autosomal recessive or X-linked genetic conditions. Inthe event that an individualisfound to be a carrier for one or more recessive conditions, carrier screening is recommend for their partner for those conditions. This can help determine if the current and future pregnancies are at increased risk for a particular X-linked or recessive genetic condition aside from Melody  Gibbs.    We discussed that one option is for Melody Gibbs to be screened for WRN only and to test his wife for that gene if his results are positive. Another option would be to pursue ECS for either Melody Gibbs or his wife in addition to Melody Gibbs carrier screening. ECS is available at any time. The couple expressed that they may potentially be interested in ECS depending on cost considerations; however, WRN carrier screening takes priority."  After a benefits investigation to determine expected out of pocket costs for the couple's testing options, they elected to begin by assessing Melody Gibbs for Melody Gibbs. 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 The following excerpt is from a telephone note from 01/16/21 in which Melody Gibbs test results were reviewed with the couple:  "We discussed that Melody Gibbs results came back positive, meaning he is a carrier for the same pathogenic variant in the Melody Gibbs gene as his sister and father. We discussed that carrier screening for this gene is recommended for his wife Melody Gibbs to determine the couple's chance of having a child with Melody Gibbs. Melody Gibbs was also present on this phone call.  I informed the couple that per a billing representative from [facility], carrier screening for the WRN gene is expected to cost $100 through [employer]. During our previous genetic counseling session, Melody Gibbs had expressed interested in more expanded carrier screening (ECS). ECS was estimated to cost $0 through [employer] through a prior benefits investigation. We discussed that if carrier screening for the WRN gene and the 289 other genes [facility]'s ECS option offers is desired, we would need to place two different orders and Melody Gibbs would need to provide two samples. This is  because WRN is not included on [facility]'s Comprehensive Carrier Screen. If she were found to  be a carrier for any other recessive condition on ECS aside from Melody Gibbs, testing Melody Gibbs for those condition(s) would be recommended and would cost $100 out of pocket.  Melody Gibbs elected to proceed with WRN carrier screening and [facility]'s Comprehensive Carrier Screening. However, she and her husband are currently in [place] until the end of the month. We made a plan for me to place the order for Melody Gibbs's tests on 01/26/21. The laboratory will mail two saliva kits to the couple's house for her. I will call her the day I place the order."  Melody Gibbs contacted me via MyChart yesterday, 01/25/21, to confirm that I had all of the information needed to place the order for WRN carrier screening and Comprehensive Carrier Screening. I confirmed that I had the necessary information and will place the orders for her testing today. The lab will mail Melody Gibbs two saliva kits, one for WRN gene testing and the other for Comprehensive Carrier Screening. Results from both tests will take ~2-3 weeks to be returned. I will call Melody Gibbs once these results become available. I encouraged her to contact me should she have any questions in the meantime.  Melody Manis, MS, [facility] Genetic Counselor

## 2021-02-04 ENCOUNTER — Ambulatory Visit: Payer: Self-pay | Admitting: Family Medicine

## 2021-02-15 ENCOUNTER — Other Ambulatory Visit: Payer: Self-pay

## 2021-02-16 ENCOUNTER — Telehealth: Payer: Self-pay | Admitting: Genetic Counselor

## 2021-02-16 NOTE — Telephone Encounter (Signed)
LVM for Ms. Mcloughlin regarding her carrier screening results for the WRN gene. Ms. Shorey was negative for Michael's variant and other pathogenic variants in the WRN gene, significantly reducing her chances of being a carrier for Werner syndrome. I informed her that these results significantly decrease the chance for Ms. Sollers and her husband to have a child with Werner syndrome.  Results from Ms. Steers's expanded carrier screening for 289 other genes is still pending. I indicated that these results will likely be returned some time this week or next week. I will call her once her results become available. I encouraged Ms. Rann to contact me should she or her husband have any questions about her WRN results before then.  Buelah Manis, MS, [facility] Genetic Counselor

## 2021-03-02 ENCOUNTER — Other Ambulatory Visit: Payer: Self-pay | Admitting: Obstetrics and Gynecology

## 2021-03-02 ENCOUNTER — Other Ambulatory Visit: Payer: Self-pay

## 2021-03-02 DIAGNOSIS — O283 Abnormal ultrasonic finding on antenatal screening of mother: Secondary | ICD-10-CM

## 2021-03-02 DIAGNOSIS — Z3A20 20 weeks gestation of pregnancy: Secondary | ICD-10-CM

## 2021-03-02 DIAGNOSIS — Z363 Encounter for antenatal screening for malformations: Secondary | ICD-10-CM

## 2021-03-03 ENCOUNTER — Ambulatory Visit: Payer: No Typology Code available for payment source | Admitting: *Deleted

## 2021-03-03 ENCOUNTER — Encounter: Payer: Self-pay | Admitting: *Deleted

## 2021-03-03 ENCOUNTER — Other Ambulatory Visit: Payer: Self-pay

## 2021-03-03 ENCOUNTER — Ambulatory Visit: Payer: No Typology Code available for payment source | Attending: Obstetrics and Gynecology

## 2021-03-03 VITALS — BP 116/60 | HR 83

## 2021-03-03 DIAGNOSIS — O283 Abnormal ultrasonic finding on antenatal screening of mother: Secondary | ICD-10-CM | POA: Diagnosis present

## 2021-03-03 DIAGNOSIS — Z363 Encounter for antenatal screening for malformations: Secondary | ICD-10-CM | POA: Insufficient documentation

## 2021-03-03 DIAGNOSIS — Z3A20 20 weeks gestation of pregnancy: Secondary | ICD-10-CM | POA: Insufficient documentation

## 2021-03-04 ENCOUNTER — Other Ambulatory Visit: Payer: Self-pay | Admitting: *Deleted

## 2021-03-04 ENCOUNTER — Telehealth: Payer: Self-pay | Admitting: Genetic Counselor

## 2021-03-04 DIAGNOSIS — O283 Abnormal ultrasonic finding on antenatal screening of mother: Secondary | ICD-10-CM

## 2021-03-04 NOTE — Telephone Encounter (Signed)
I left a voicemail for Ms. Melichar to discuss her negative expanded carrier screening (ECS) results. Specifically, Ms. Kehres had ECS performed through [facility]. Ms. Atilano was negative for pathogenic variants analyzed in 289 genes associated with autosomal recessive and X-linked disorders (listed separately in the laboratory report). I informed Ms. Wolfer that these negative results mean that her risk to be a carrier for these conditions has been reduced, but not fully eliminated. This also significantly reduces the couple's risk of having a child affected by one of these conditions. I reminded Ms. Holben that this test does not assess for all possible genetic conditions.   Given that Ms. Zeiger's results were negative, it is not necessary to perform carrier screening for her husband for any of the 289 conditions that Ms. Reen was tested for at this time. I encouraged Ms. Schwieger to call me or send me a MyChart message if she has any questions.  Buelah Manis, MS, [facility] Genetic Counselor

## 2021-03-13 ENCOUNTER — Encounter: Payer: Self-pay | Admitting: Family Medicine

## 2021-03-13 ENCOUNTER — Ambulatory Visit (INDEPENDENT_AMBULATORY_CARE_PROVIDER_SITE_OTHER): Payer: No Typology Code available for payment source | Admitting: Family Medicine

## 2021-03-13 ENCOUNTER — Other Ambulatory Visit: Payer: Self-pay

## 2021-03-13 VITALS — BP 112/80 | HR 64 | Temp 98.1°F | Resp 17 | Ht 67.0 in | Wt 169.2 lb

## 2021-03-13 DIAGNOSIS — I493 Ventricular premature depolarization: Secondary | ICD-10-CM

## 2021-03-13 DIAGNOSIS — O358XX0 Maternal care for other (suspected) fetal abnormality and damage, not applicable or unspecified: Secondary | ICD-10-CM | POA: Diagnosis not present

## 2021-03-13 DIAGNOSIS — Z1283 Encounter for screening for malignant neoplasm of skin: Secondary | ICD-10-CM

## 2021-03-13 DIAGNOSIS — Z636 Dependent relative needing care at home: Secondary | ICD-10-CM | POA: Insufficient documentation

## 2021-03-13 DIAGNOSIS — O35BXX0 Maternal care for other (suspected) fetal abnormality and damage, fetal cardiac anomalies, not applicable or unspecified: Secondary | ICD-10-CM

## 2021-03-13 NOTE — Patient Instructions (Signed)
Follow up in 1 year or as needed No need for labs today- yay!!! Continue to work on stress management- you've got a lot on your plate!!! Call with any questions or concerns Stay Safe!  Stay Healthy! Welcome!  We're glad to have you!!

## 2021-03-13 NOTE — Assessment & Plan Note (Addendum)
New to provider, ongoing for pt.  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  Grandmother fell again last night and broke her hip.  Hospice is now involved.  Pt reports it is a lot to deal with but she feels well supported by her husband.  Encouraged her to take advantage of Hospice resources for family members.

## 2021-03-13 NOTE — Assessment & Plan Note (Addendum)
Pt is currently asymptomatic but there is a chance of recurrence due to pregnancy and increased stress recently.  She has cut out caffeine.  She is actively working on Child psychotherapist and mindfulness.  She is to let me know if she again develops symptoms.

## 2021-03-13 NOTE — Progress Notes (Signed)
   Subjective:    Patient ID: Melody Gibbs, female    DOB: Aug 22, 1986, 35 y.o.   MRN: [id number]  HPI New to establish.  Recently moved to the area [place]) from [facility]  Pregnant- 21.5 weeks.  First baby.  Following w/ Dr Willis Modena.  Anatomy scan showed possible pulmonary mass.  MFM could not find evidence of pulmonary mass.  Fetal heart has L axis and she is being referred to Peds Cards at [facility].  Pt reports she is sleeping well, continues to exercise.  Pt is understandably anxious regarding the possibility of congenital cardiac defects.  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  Grandmother fell last night and broke her hip.  Hospice is now involved.  They are working on transitioning to Hospice house  PVCs- pt saw EP last year and was prescribed Metoprolol.  She took it for a couple of days and then stopped.  'I used it as like a re-set'.  PVCs were/are stress related.  Has cut out caffeine.  No CP, SOB, edema    Review of Systems For ROS see HPI   This visit occurred during the SARS-CoV-2 public health emergency.  Safety protocols were in place, including screening questions prior to the visit, additional usage of staff PPE, and extensive cleaning of exam room while observing appropriate contact time as indicated for disinfecting solutions.       Objective:   Physical Exam Vitals reviewed.  Constitutional:      General: She is not in acute distress.    Appearance: Normal appearance. She is well-developed.  HENT:     Head: Normocephalic and atraumatic.  Eyes:     Conjunctiva/sclera: Conjunctivae normal.     Pupils: Pupils are equal, round, and reactive to light.  Neck:     Thyroid: No thyromegaly.  Cardiovascular:     Rate and Rhythm: Normal rate and regular rhythm.     Heart sounds: Normal heart sounds. No murmur heard.   Pulmonary:     Effort: Pulmonary effort is normal. No respiratory distress.     Breath sounds: Normal  breath sounds.  Abdominal:     Comments: gravid  Musculoskeletal:     Cervical back: Normal range of motion and neck supple.     Right lower leg: No edema.     Left lower leg: No edema.  Lymphadenopathy:     Cervical: No cervical adenopathy.  Skin:    General: Skin is warm and dry.  Neurological:     Mental Status: She is alert and oriented to person, place, and time.  Psychiatric:        Behavior: Behavior normal.           Assessment & Plan:  Pregnancy complicated by possible cardiovascular abnormality- pt has had a roller coaster of emotions the last few weeks when anatomy scan indicated possible pulmonary mass/sequestration and they were referred to MFM.  MFM did not see any pulmonary issues but noticed heart had strange L axis.  They have upcoming appt w/ Peds Cardiology.  Encouraged her to reach out if things became too overwhelming.  Will follow along.

## 2021-03-14 IMAGING — US US MFM OB DETAIL+14 WK
1 series · 12 of 28 positions shown · non-contrast
Comparison: none

[Series 1: us mfm ob detail+14 wk · 198 acquisitions, 12 frames shown]
[im 8/198]
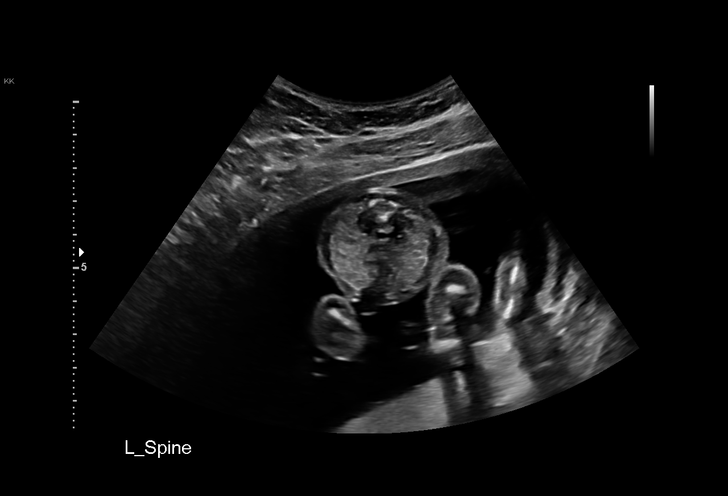
[im 22/198]
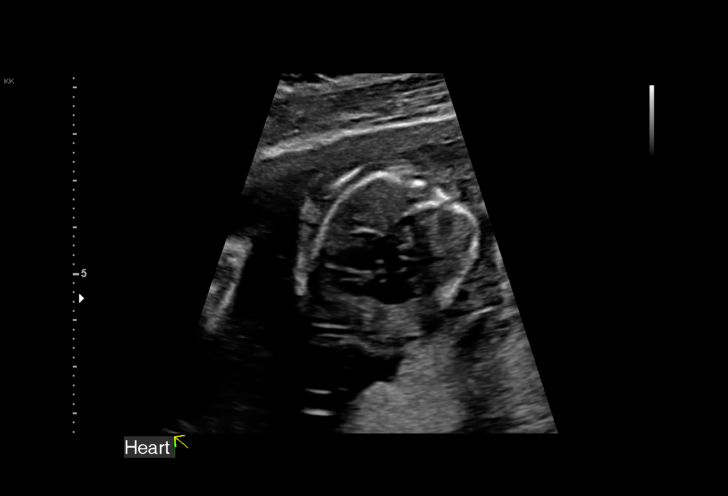
[im 37/198]
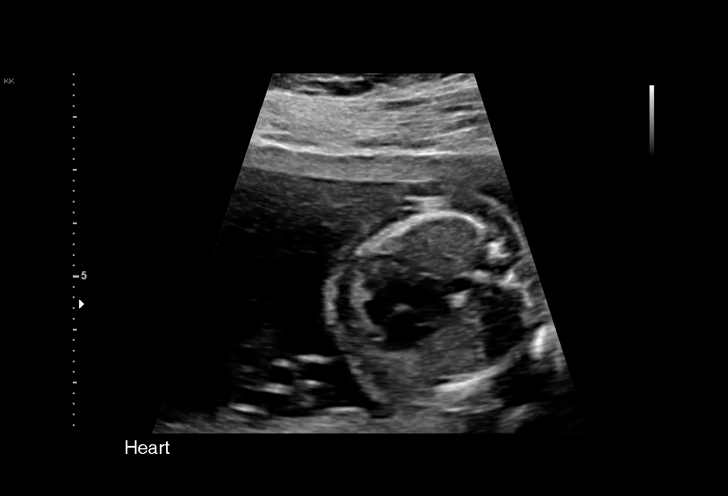
[im 59/198]
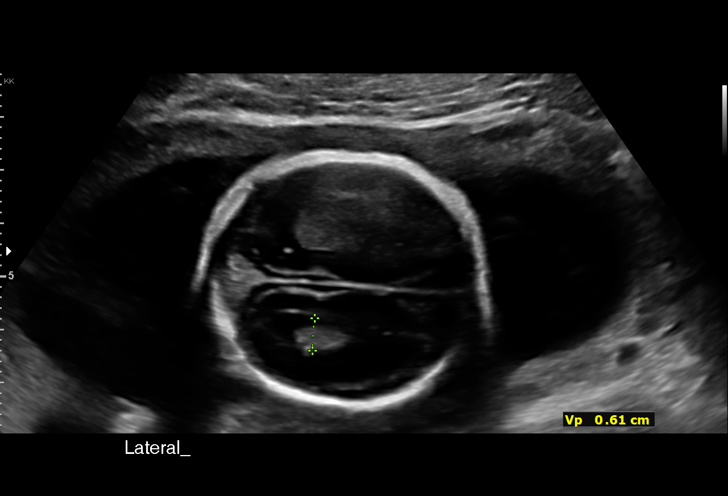
[im 73/198]
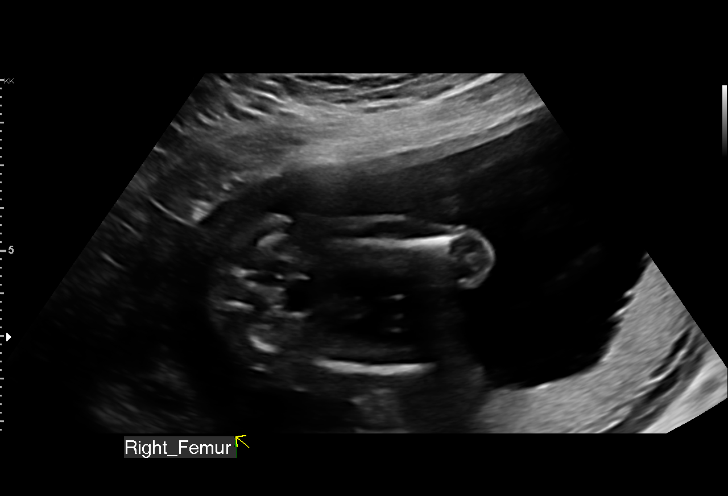
[im 88/198]
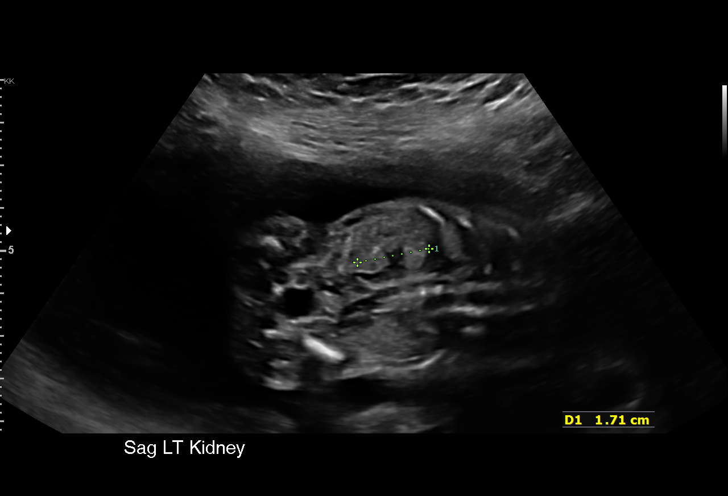
[im 110/198]
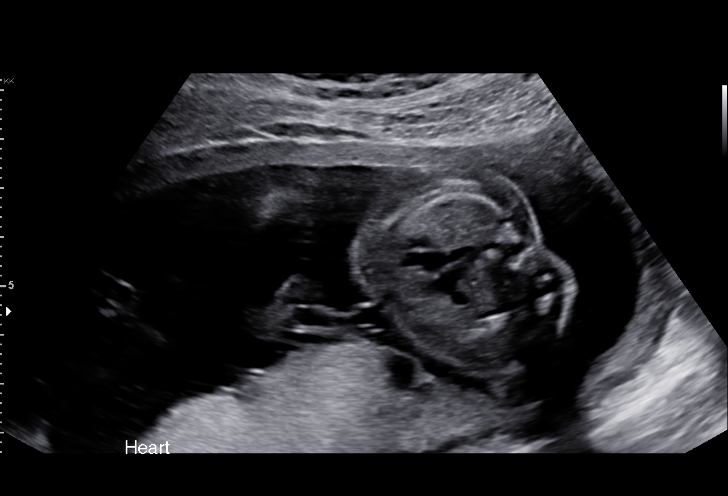
[im 125/198]
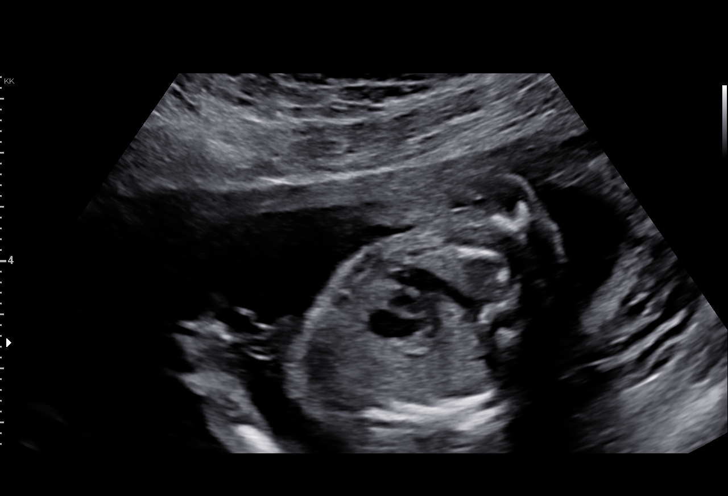
[im 139/198]
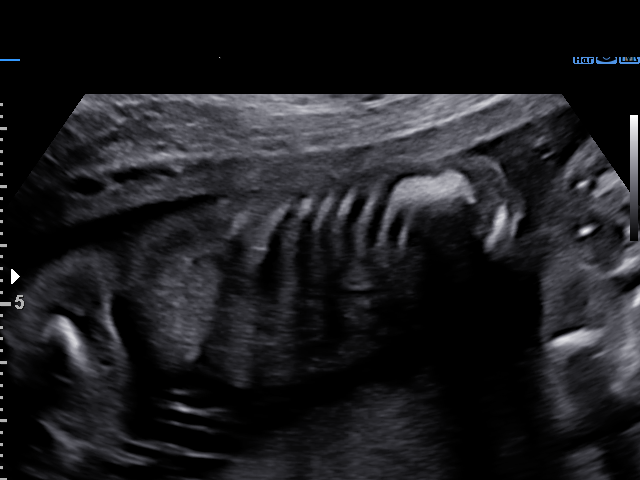
[im 161/198]
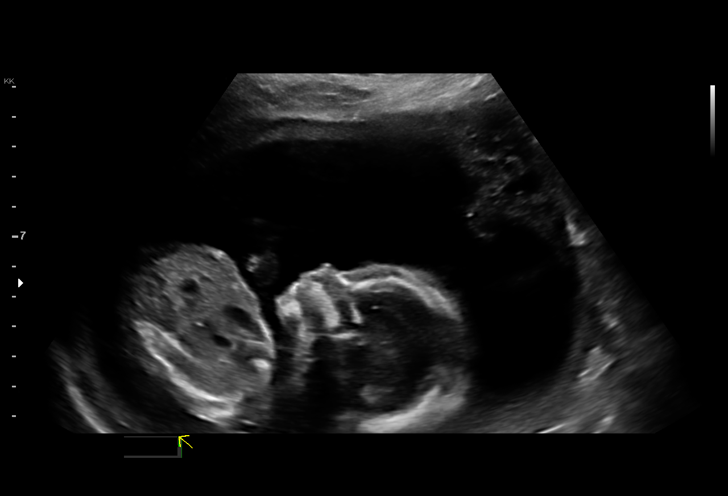
[im 176/198]
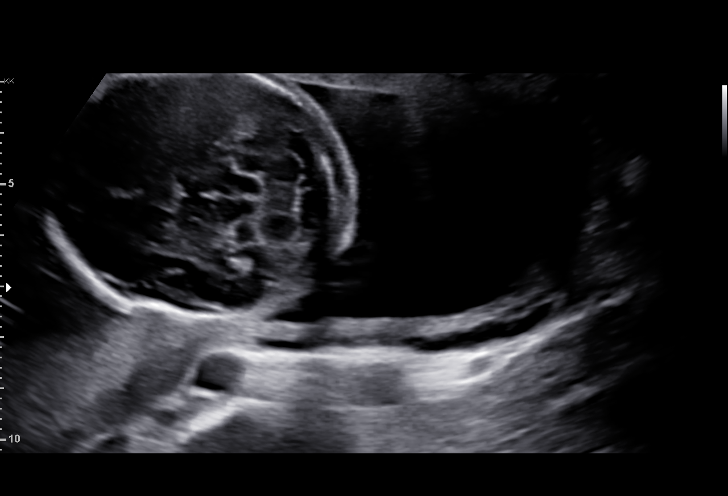
[im 190/198]
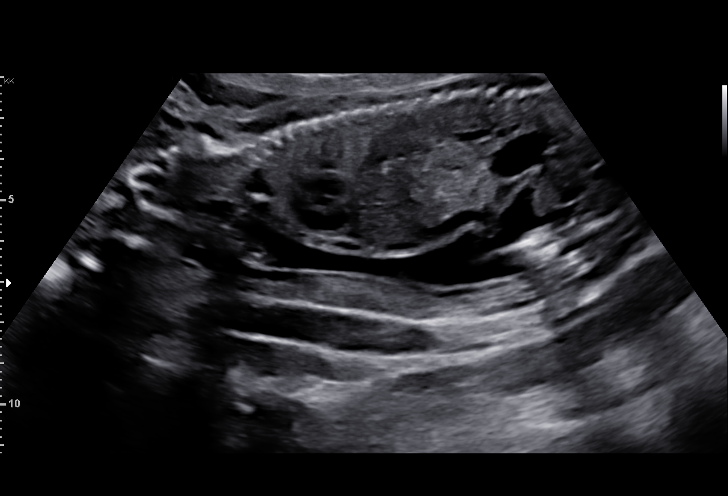

[12 of 28 positions shown; findings below may reference images not displayed]

[HOSPITAL],
                                                            Inc.

Indications

 Fetal abnormality - other known or
 suspected (abnormal axis of heart)
 Encounter for antenatal screening for
 malformations
 Negative [facility]
 20 weeks gestation of pregnancy
Fetal Evaluation

 Num Of Fetuses:         1
 Fetal Heart Rate(bpm):  157
 Cardiac Activity:       Observed
 Presentation:           Variable
 Placenta:               Posterior
 P. Cord Insertion:      Visualized

 Amniotic Fluid
 AFI FV:      Within normal limits

                             Largest Pocket(cm)

Biometry
 BPD:      50.2  mm     G. Age:  21w 1d         90  %    CI:        77.05   %    70 - 86
                                                         FL/HC:      16.9   %    16.8 -
 HC:      181.1  mm     G. Age:  20w 4d         67  %    HC/AC:      1.25        1.09 -
 AC:      144.9  mm     G. Age:  19w 6d         38  %    FL/BPD:     61.0   %
 FL:       30.6  mm     G. Age:  19w 3d         24  %    FL/AC:      21.1   %    20 - 24
 HUM:      30.6  mm     G. Age:  20w 1d         57  %
 CER:      20.3  mm     G. Age:  19w 4d         57  %
 NFT:       3.7  mm

 LV:        6.1  mm
 CM:        3.3  mm

 Est. FW:     313  gm    0 lb 11 oz      33  %
OB History

 Gravidity:    1         Term:   0        Prem:   0        SAB:   0
 TOP:          0       Ectopic:  0        Living: 0
Gestational Age

 LMP:           20w 0d        Date:  10/14/20                 EDD:   07/21/21
 U/S Today:     20w 2d                                        EDD:   07/19/21
 Best:          20w 0d     Det. By:  LMP  (10/14/20)          EDD:   07/21/21
Anatomy

 Cranium:               Appears normal         Aortic Arch:            Not well visualized
 Cavum:                 Appears normal         Ductal Arch:            Not well visualized
 Ventricles:            Appears normal         Diaphragm:              Appears normal
 Choroid Plexus:        Appears normal         Stomach:                Appears normal, left
                                                                       sided
 Cerebellum:            Appears normal         Abdomen:                Appears normal
 Posterior Fossa:       Appears normal         Abdominal Wall:         Appears nml (cord
                                                                       insert, abd wall)
 Nuchal Fold:           Appears normal         Cord Vessels:           Appears normal (3
                                                                       vessel cord)
 Face:                  Appears normal         Kidneys:                Appear normal
                        (orbits and profile)
 Lips:                  Appears normal         Bladder:                Appears normal
 Thoracic:              Appears normal         Spine:                  Appears normal
 Heart:                 Abnormal, see          Upper Extremities:      Appears normal
                        comments
 RVOT:                  Appears normal         Lower Extremities:      Appears normal
 LVOT:                  Appears normal

 Other:  Parents do not wish to know sex of fetus.
Cervix Uterus Adnexa

 Cervix
 Length:           3.55  cm.
 Normal appearance by transabdominal scan.

 Uterus
 Intrauterine synechiae seen.
 Adnexa
 No abnormality visualized.
Comments

 This patient was seen for a detailed fetal anatomy scan as a
 possible fetal chest mass and the abnormal cardiac axis were
 noted on ultrasound performed in your office.  Her husband
 has a family history of Yoselyn syndrome.  The patient has
 already met with our genetic counselor regarding the
 transmission of Yoselyn syndrome to her fetus.
 She denies any significant past medical history and denies
 any problems in her current pregnancy.
 She had a cell free DNA test earlier in her pregnancy which
 indicated a low risk for trisomy 21, 18, and 13.
 She was informed that the fetal growth and amniotic fluid
 level were appropriate for her gestational age.
 On today's exam, the fetal cardiac axis (approximately 70
 degrees) appeared abnormal.  She was advised that the
 normal cardiac axis varies between 45 to 60 degrees.  An
 abnormal cardiac axis may indicate an increased risk of a
 congenital heart defect or a mass in the chest that may be
 displacing the heart.
 Based on the cardiac views that were visualized today, I do
 not suspect that a congenital heart defect is present.  I do not
 believe that a chest mass is present.  The fetal stomach
 appeared to be in the abdomen.
 The patient was advised that hopefully, the abnormal cardiac
 axis is just a normal variant.  The limitations of ultrasound in
 the detection of all anomalies was discussed.
 The patient was informed that anomalies may be missed due
 to technical limitations. If the fetus is in a suboptimal position
 or maternal habitus is increased, visualization of the fetus in
 the maternal uterus may be impaired.
 Due to the abnormal fetal cardiac axis noted today, she was
 offered and declined an amniocentesis for definitive
 diagnosis of fetal aneuploidy.
 Due to the abnormal fetal cardiac axis noted today, a referral
 was made for a fetal echocardiogram with [facility]
 cardiology.
 The availability of a fetal MRI for detection of a chest mass
 was discussed.  She would prefer not to have the exam at
 this time.  She will consider having the MRI based on the
 findings from her fetal echocardiogram.
 A follow-up exam was scheduled in our office in 4 weeks.

## 2021-03-23 ENCOUNTER — Encounter: Payer: Self-pay | Admitting: Pediatric Cardiology

## 2021-04-02 ENCOUNTER — Other Ambulatory Visit: Payer: Self-pay

## 2021-04-02 ENCOUNTER — Encounter: Payer: Self-pay | Admitting: *Deleted

## 2021-04-02 ENCOUNTER — Ambulatory Visit: Payer: No Typology Code available for payment source | Admitting: *Deleted

## 2021-04-02 ENCOUNTER — Ambulatory Visit: Payer: No Typology Code available for payment source | Attending: Obstetrics

## 2021-04-02 ENCOUNTER — Other Ambulatory Visit: Payer: Self-pay | Admitting: *Deleted

## 2021-04-02 VITALS — BP 115/64 | HR 61

## 2021-04-02 DIAGNOSIS — O283 Abnormal ultrasonic finding on antenatal screening of mother: Secondary | ICD-10-CM | POA: Insufficient documentation

## 2021-04-02 DIAGNOSIS — Z3A24 24 weeks gestation of pregnancy: Secondary | ICD-10-CM

## 2021-04-02 DIAGNOSIS — O359XX0 Maternal care for (suspected) fetal abnormality and damage, unspecified, not applicable or unspecified: Secondary | ICD-10-CM | POA: Diagnosis not present

## 2021-04-02 DIAGNOSIS — Z3689 Encounter for other specified antenatal screening: Secondary | ICD-10-CM

## 2021-06-11 ENCOUNTER — Other Ambulatory Visit: Payer: Self-pay

## 2021-06-11 ENCOUNTER — Encounter: Payer: Self-pay | Admitting: *Deleted

## 2021-06-11 ENCOUNTER — Other Ambulatory Visit: Payer: Self-pay | Admitting: *Deleted

## 2021-06-11 ENCOUNTER — Ambulatory Visit: Payer: No Typology Code available for payment source | Admitting: *Deleted

## 2021-06-11 ENCOUNTER — Ambulatory Visit: Payer: No Typology Code available for payment source | Attending: Obstetrics

## 2021-06-11 VITALS — BP 102/62 | HR 66

## 2021-06-11 DIAGNOSIS — O283 Abnormal ultrasonic finding on antenatal screening of mother: Secondary | ICD-10-CM | POA: Insufficient documentation

## 2021-06-11 DIAGNOSIS — Z3689 Encounter for other specified antenatal screening: Secondary | ICD-10-CM | POA: Diagnosis present

## 2021-06-11 DIAGNOSIS — O3663X0 Maternal care for excessive fetal growth, third trimester, not applicable or unspecified: Secondary | ICD-10-CM

## 2021-06-11 DIAGNOSIS — O359XX0 Maternal care for (suspected) fetal abnormality and damage, unspecified, not applicable or unspecified: Secondary | ICD-10-CM | POA: Diagnosis not present

## 2021-06-11 DIAGNOSIS — Z3A34 34 weeks gestation of pregnancy: Secondary | ICD-10-CM | POA: Diagnosis not present

## 2021-06-22 IMAGING — US US MFM OB FOLLOW-UP
1 series · 13 of 22 positions shown · non-contrast
Comparison: none

[Series 1: us mfm ob follow-up · 22 acquisitions, 13 frames shown]
[im 1/22]
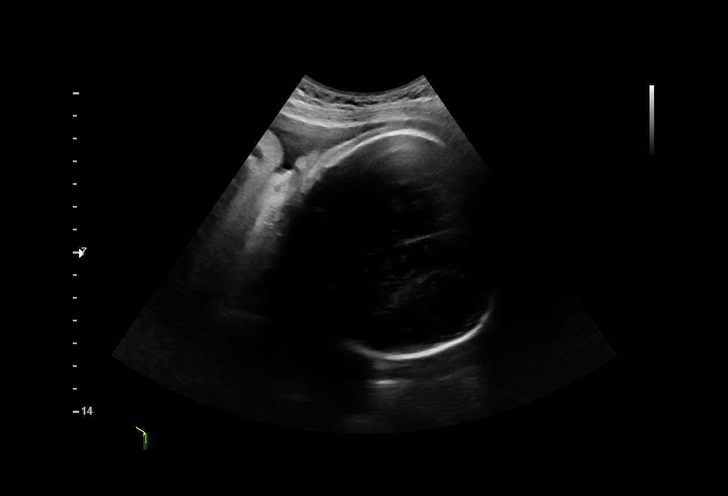
[im 3/22]
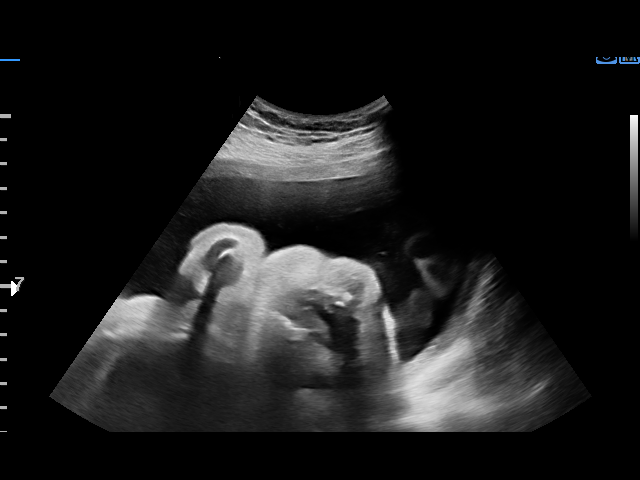
[im 5/22]
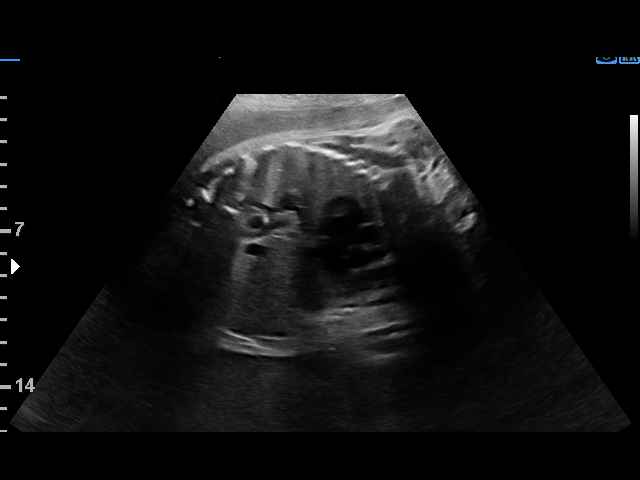
[im 6/22]
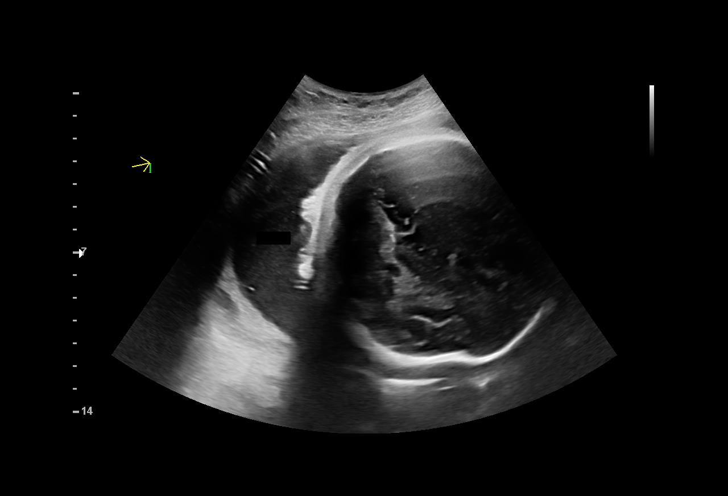
[im 8/22]
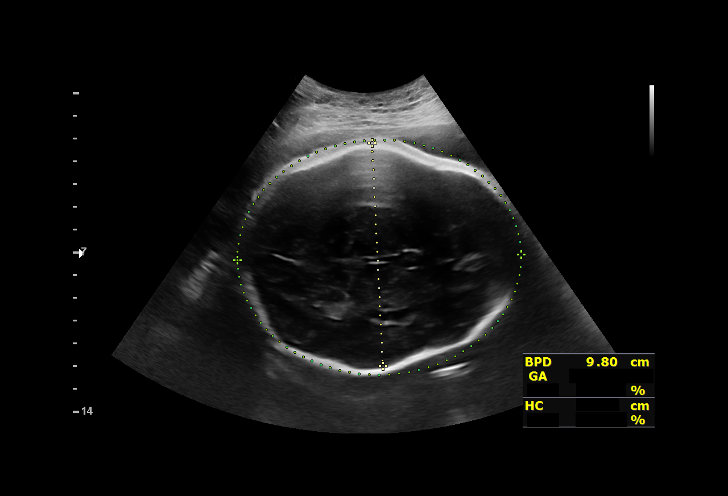
[im 10/22]
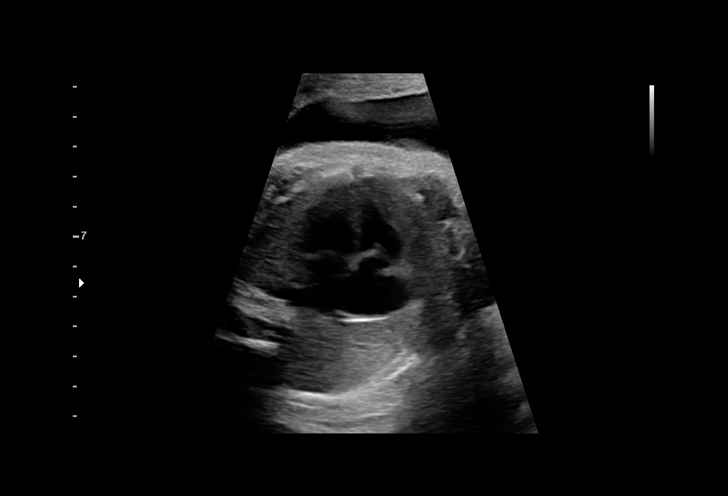
[im 12/22]
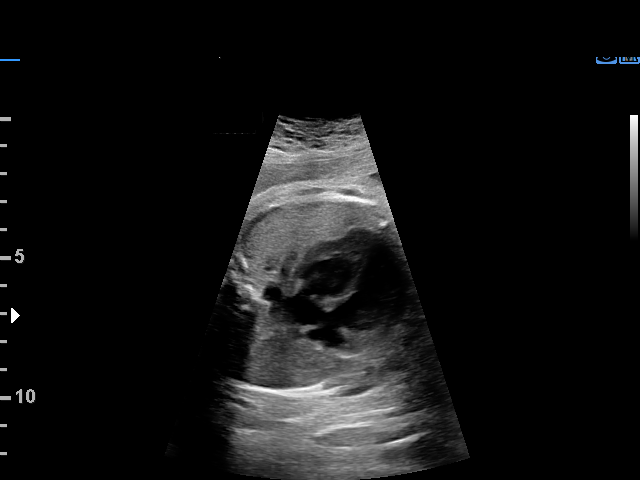
[im 13/22]
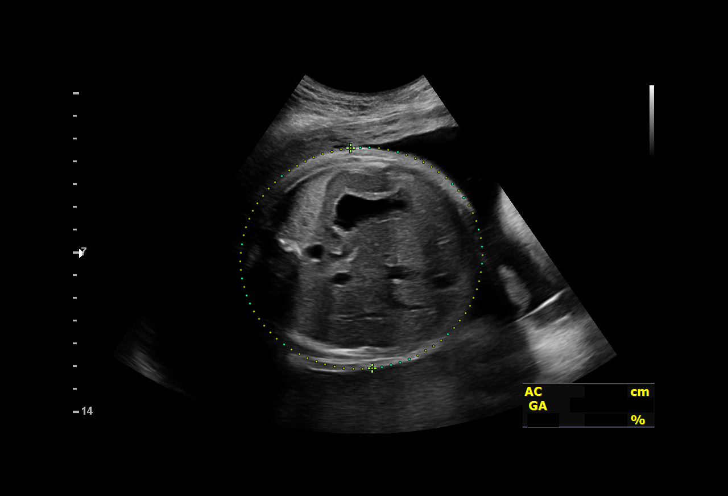
[im 15/22]
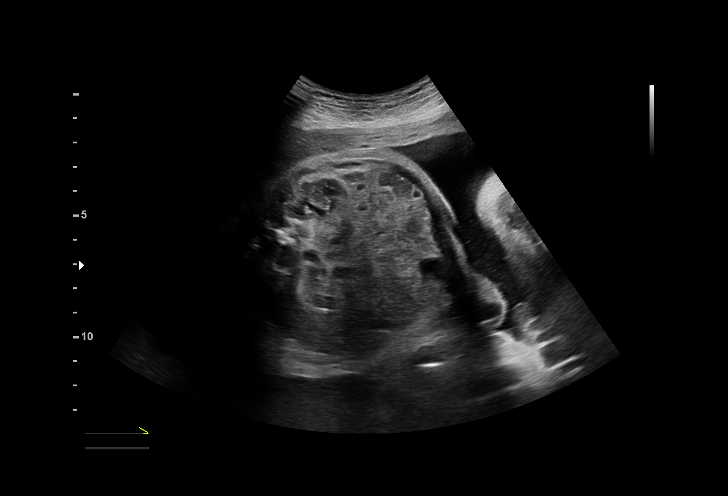
[im 17/22]
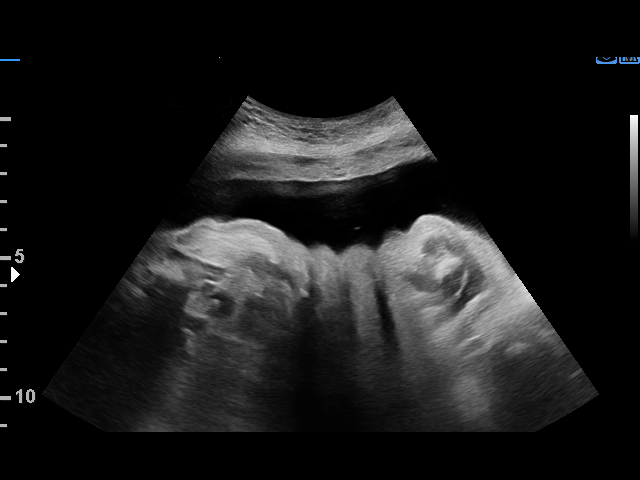
[im 18/22]
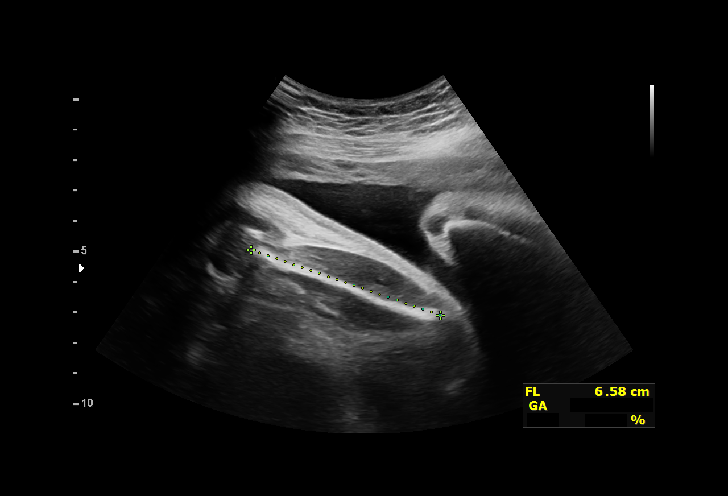
[im 20/22]
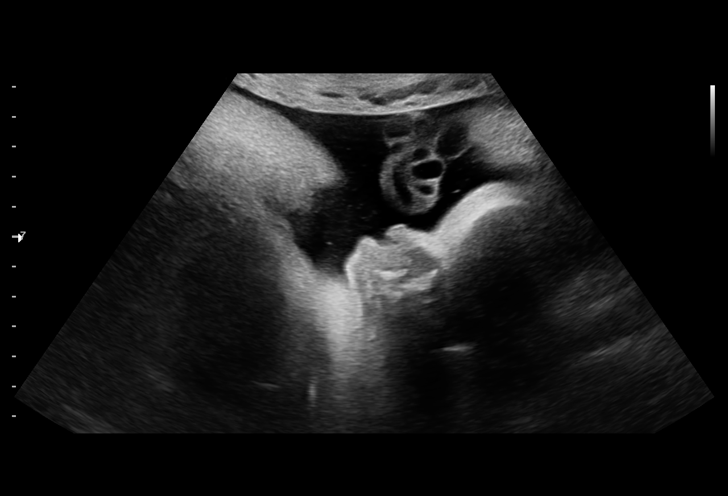
[im 22/22]
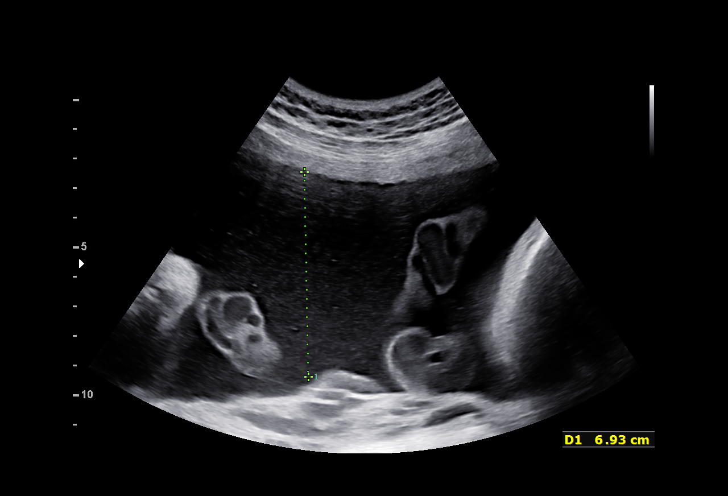

[13 of 22 positions shown; findings below may reference images not displayed]

[HOSPITAL],
                                                            Inc.

Indications

 Fetal abnormality - other known or
 suspected (abnormal axis of heart)
 34 weeks gestation of pregnancy
 Negative [facility]
Fetal Evaluation

 Num Of Fetuses:         1
 Fetal Heart Rate(bpm):  162
 Cardiac Activity:       Observed
 Presentation:           Cephalic
 Placenta:               Posterior
 P. Cord Insertion:      Previously Visualized

 Amniotic Fluid
 AFI FV:      Within normal limits

 AFI Sum(cm)     %Tile       Largest Pocket(cm)
 21.14           79

 RUQ(cm)       RLQ(cm)       LUQ(cm)        LLQ(cm)

Biophysical Evaluation
 Amniotic F.V:   Within normal limits       F. Tone:        Observed
 F. Movement:    Observed                   Score:          [DATE]
 F. Breathing:   Observed
Biometry

 BPD:      97.5  mm     G. Age:  39w 6d       > 99  %    CI:        73.16   %    70 - 86
                                                         FL/HC:      18.2   %    19.4 -
 HC:      362.3  mm     G. Age:  42w 6d       > 99  %    HC/AC:      1.13        0.96 -
 AC:      321.9  mm     G. Age:  36w 1d         93  %    FL/BPD:     67.7   %    71 - 87
 FL:         66  mm     G. Age:  34w 0d         33  %    FL/AC:      20.5   %    20 - 24

 Est. FW:    1552  gm    6 lb 10 oz      96  %
OB History

 Gravidity:    1         Term:   0        Prem:   0        SAB:   0
 TOP:          0       Ectopic:  0        Living: 0
Gestational Age

 LMP:           34w 2d        Date:  10/14/20                 EDD:   07/21/21
 U/S Today:     38w 2d                                        EDD:   06/23/21
 Best:          34w 2d     Det. By:  LMP  (10/14/20)          EDD:   07/21/21
Anatomy

 Cranium:               Appears normal         Aortic Arch:            Not well visualized
 Cavum:                 Previously seen        Ductal Arch:            Not well visualized
 Ventricles:            Appears normal         Diaphragm:              Previously seen
 Choroid Plexus:        Previously seen        Stomach:                Appears normal, left
                                                                       sided
 Cerebellum:            Previously seen        Abdomen:                Appears normal
 Posterior Fossa:       Previously seen        Abdominal Wall:         Appears nml (cord
                                                                       insert, abd wall)
 Nuchal Fold:           Not applicable (>20    Cord Vessels:           Appears normal (3
                        wks GA)                                        vessel cord)
 Face:                  Orbits and profile     Kidneys:                Appear normal
                        previously seen
 Lips:                  Previously seen        Bladder:                Appears normal
 Thoracic:              Previously seen        Spine:                  Appears normal
 Heart:                 Abnormal axis          Upper Extremities:      Appears normal
 RVOT:                  Previously seen        Lower Extremities:      Appears normal
 LVOT:                  Previously seen

 Other:  Parents do not wish to know sex of fetus.
Cervix Uterus Adnexa

 Cervix
 Not visualized (advanced GA >13wks)

 Right Ovary
 Not visualized.
 Left Ovary
 Not visualized.
Comments

 This patient was seen for a follow up growth scan due to a
 fetus with an abnormal cardiac axis.  The patient and her
 husband report that they were both large babies.  She denies
 any problems since her last exam and reports that she has
 screened negative for gestational diabetes in her current
 pregnancy.
 She was informed that the fetal growth measures large for
 her gestational age.  The overall EFW measured at the 96
 percentile for her gestational age.  There was normal
 amniotic fluid noted.
 A follow up exam was scheduled in 4 weeks to assess the
 fetal growth closer to delivery.  Should a large for gestational
 age fetus continue to be noted at her next exam, an induction
 of labor may be considered at around 39 weeks to increase
 her chances of a successful vaginal delivery.

## 2021-06-24 ENCOUNTER — Encounter: Payer: Self-pay | Admitting: *Deleted

## 2021-07-10 ENCOUNTER — Ambulatory Visit: Payer: No Typology Code available for payment source | Attending: Obstetrics

## 2021-07-10 ENCOUNTER — Ambulatory Visit: Payer: No Typology Code available for payment source | Admitting: *Deleted

## 2021-07-10 ENCOUNTER — Other Ambulatory Visit: Payer: Self-pay

## 2021-07-10 ENCOUNTER — Encounter: Payer: Self-pay | Admitting: *Deleted

## 2021-07-10 VITALS — BP 104/68 | HR 70

## 2021-07-10 DIAGNOSIS — O3663X0 Maternal care for excessive fetal growth, third trimester, not applicable or unspecified: Secondary | ICD-10-CM

## 2021-07-10 DIAGNOSIS — O359XX0 Maternal care for (suspected) fetal abnormality and damage, unspecified, not applicable or unspecified: Secondary | ICD-10-CM

## 2021-07-10 DIAGNOSIS — Z3A38 38 weeks gestation of pregnancy: Secondary | ICD-10-CM | POA: Diagnosis not present

## 2021-07-17 ENCOUNTER — Telehealth (HOSPITAL_COMMUNITY): Payer: Self-pay | Admitting: *Deleted

## 2021-07-21 ENCOUNTER — Other Ambulatory Visit (HOSPITAL_COMMUNITY)
Admission: RE | Admit: 2021-07-21 | Discharge: 2021-07-21 | Disposition: A | Discharge status: Home / Self Care | Payer: No Typology Code available for payment source | Source: Ambulatory Visit | Attending: Obstetrics and Gynecology | Admitting: Obstetrics and Gynecology

## 2021-07-21 ENCOUNTER — Inpatient Hospital Stay (HOSPITAL_COMMUNITY): Admit: 2021-07-21 | Payer: No Typology Code available for payment source

## 2021-07-21 DIAGNOSIS — Z20822 Contact with and (suspected) exposure to covid-19: Secondary | ICD-10-CM | POA: Diagnosis not present

## 2021-07-21 DIAGNOSIS — Z01812 Encounter for preprocedural laboratory examination: Secondary | ICD-10-CM | POA: Insufficient documentation

## 2021-07-21 LAB — SARS CORONAVIRUS 2 (TAT 6-24 HRS): SARS Coronavirus 2: NEGATIVE

## 2021-07-21 NOTE — Telephone Encounter (Signed)
Preadmission screen  

## 2021-07-22 ENCOUNTER — Telehealth (HOSPITAL_COMMUNITY): Payer: Self-pay | Admitting: *Deleted

## 2021-07-22 ENCOUNTER — Other Ambulatory Visit: Payer: Self-pay | Admitting: Obstetrics and Gynecology

## 2021-07-22 ENCOUNTER — Encounter (HOSPITAL_COMMUNITY): Payer: Self-pay

## 2021-07-22 NOTE — Telephone Encounter (Signed)
Preadmission screen  

## 2021-07-23 ENCOUNTER — Inpatient Hospital Stay (HOSPITAL_COMMUNITY): Payer: No Typology Code available for payment source

## 2021-07-27 ENCOUNTER — Other Ambulatory Visit: Payer: Self-pay | Admitting: Obstetrics and Gynecology

## 2021-07-27 LAB — SARS CORONAVIRUS 2 (TAT 6-24 HRS): SARS Coronavirus 2: NEGATIVE

## 2021-07-29 ENCOUNTER — Inpatient Hospital Stay (HOSPITAL_COMMUNITY): Payer: No Typology Code available for payment source

## 2021-07-29 ENCOUNTER — Inpatient Hospital Stay (HOSPITAL_COMMUNITY)
Admission: AD | Admit: 2021-07-29 | Discharge: 2021-08-01 | DRG: 788 | Disposition: A | Discharge status: Home / Self Care | Payer: No Typology Code available for payment source | Attending: Obstetrics and Gynecology | Admitting: Obstetrics and Gynecology

## 2021-07-29 ENCOUNTER — Inpatient Hospital Stay (HOSPITAL_COMMUNITY)
Admission: AD | Admit: 2021-07-29 | Payer: No Typology Code available for payment source | Source: Home / Self Care | Admitting: Obstetrics and Gynecology

## 2021-07-29 DIAGNOSIS — O480 Post-term pregnancy: Secondary | ICD-10-CM | POA: Diagnosis present

## 2021-07-29 DIAGNOSIS — Z98891 History of uterine scar from previous surgery: Secondary | ICD-10-CM

## 2021-07-29 DIAGNOSIS — O3663X0 Maternal care for excessive fetal growth, third trimester, not applicable or unspecified: Secondary | ICD-10-CM | POA: Diagnosis present

## 2021-07-29 DIAGNOSIS — Z3A41 41 weeks gestation of pregnancy: Secondary | ICD-10-CM

## 2021-07-29 LAB — CBC
HCT: 37.8 % (ref 36.0–46.0)
Hemoglobin: 13 g/dL (ref 12.0–15.0)
MCH: 30.6 pg (ref 26.0–34.0)
MCHC: 34.4 g/dL (ref 30.0–36.0)
MCV: 88.9 fL (ref 80.0–100.0)
Platelets: 193 10*3/uL (ref 150–400)
RBC: 4.25 MIL/uL (ref 3.87–5.11)
RDW: 13.1 % (ref 11.5–15.5)
WBC: 9.5 10*3/uL (ref 4.0–10.5)
nRBC: 0 % (ref 0.0–0.2)

## 2021-07-29 LAB — TYPE AND SCREEN
ABO/RH(D): O POS
Antibody Screen: NEGATIVE

## 2021-07-29 MED ORDER — EPHEDRINE 5 MG/ML INJ: 10.0000 mg | INTRAVENOUS | Status: DC | PRN

## 2021-07-29 MED ORDER — LACTATED RINGERS IV SOLN: INTRAVENOUS | Status: DC

## 2021-07-29 MED ORDER — LACTATED RINGERS IV SOLN: 500.0000 mL | Freq: Once | INTRAVENOUS | Status: DC

## 2021-07-29 MED ORDER — BUTORPHANOL TARTRATE 1 MG/ML IJ SOLN
1.0000 mg | INTRAMUSCULAR | Status: DC | PRN
Start: 2021-07-29 — End: 2021-07-30

## 2021-07-29 MED ORDER — LIDOCAINE HCL (PF) 1 % IJ SOLN: 30.0000 mL | INTRAMUSCULAR | Status: DC | PRN

## 2021-07-29 MED ORDER — SOD CITRATE-CITRIC ACID 500-334 MG/5ML PO SOLN
30.0000 mL | ORAL | Status: DC | PRN
  Administered 2021-07-30: 30 mL via ORAL
  Filled 2021-07-29: qty 30

## 2021-07-29 MED ORDER — FENTANYL-BUPIVACAINE-NACL 0.5-0.125-0.9 MG/250ML-% EP SOLN
12.0000 mL/h | EPIDURAL | Status: DC | PRN
  Administered 2021-07-30: 12 mL/h via EPIDURAL
  Filled 2021-07-29: qty 250

## 2021-07-29 MED ORDER — OXYCODONE-ACETAMINOPHEN 5-325 MG PO TABS: 2.0000 | ORAL_TABLET | ORAL | Status: DC | PRN

## 2021-07-29 MED ORDER — PHENYLEPHRINE 40 MCG/ML (10ML) SYRINGE FOR IV PUSH (FOR BLOOD PRESSURE SUPPORT)
80.0000 ug | PREFILLED_SYRINGE | INTRAVENOUS | Status: DC | PRN
Start: 2021-07-29 — End: 2021-07-30
  Filled 2021-07-29: qty 10

## 2021-07-29 MED ORDER — TERBUTALINE SULFATE 1 MG/ML IJ SOLN: 0.2500 mg | Freq: Once | INTRAMUSCULAR | Status: DC | PRN

## 2021-07-29 MED ORDER — OXYCODONE-ACETAMINOPHEN 5-325 MG PO TABS
1.0000 | ORAL_TABLET | ORAL | Status: DC | PRN
Start: 2021-07-29 — End: 2021-07-30

## 2021-07-29 MED ORDER — LACTATED RINGERS IV SOLN
500.0000 mL | INTRAVENOUS | Status: DC | PRN
  Administered 2021-07-30: 500 mL via INTRAVENOUS

## 2021-07-29 MED ORDER — ACETAMINOPHEN 325 MG PO TABS: 650.0000 mg | ORAL_TABLET | ORAL | Status: DC | PRN

## 2021-07-29 MED ORDER — DIPHENHYDRAMINE HCL 50 MG/ML IJ SOLN
12.5000 mg | INTRAMUSCULAR | Status: DC | PRN
Start: 2021-07-29 — End: 2021-07-30

## 2021-07-29 MED ORDER — OXYTOCIN BOLUS FROM INFUSION: 333.0000 mL | Freq: Once | INTRAVENOUS | Status: DC

## 2021-07-29 MED ORDER — OXYTOCIN-SODIUM CHLORIDE 30-0.9 UT/500ML-% IV SOLN
2.5000 [IU]/h | INTRAVENOUS | Status: DC
  Administered 2021-07-30: 2.5 [IU]/h via INTRAVENOUS

## 2021-07-29 MED ORDER — PHENYLEPHRINE 40 MCG/ML (10ML) SYRINGE FOR IV PUSH (FOR BLOOD PRESSURE SUPPORT)
80.0000 ug | PREFILLED_SYRINGE | INTRAVENOUS | Status: DC | PRN
  Administered 2021-07-30: 80 ug via INTRAVENOUS

## 2021-07-29 MED ORDER — ONDANSETRON HCL 4 MG/2ML IJ SOLN
4.0000 mg | Freq: Four times a day (QID) | INTRAMUSCULAR | Status: DC | PRN
Start: 2021-07-29 — End: 2021-07-30

## 2021-07-29 MED ORDER — OXYTOCIN-SODIUM CHLORIDE 30-0.9 UT/500ML-% IV SOLN
1.0000 m[IU]/min | INTRAVENOUS | Status: DC
  Administered 2021-07-29: 2 m[IU]/min via INTRAVENOUS
  Filled 2021-07-29: qty 500

## 2021-07-29 NOTE — Progress Notes (Signed)
Not terribly uncomfortable with contractions, desires AROM before epidural in order to ambulate more  BP 109/62   Pulse 69   Temp 98.1 F (36.7 C) (Oral)   Ht '5\' 7"'$  (1.702 m)   Wt 86.8 kg   LMP 10/14/2020 (Exact Date)   BMI 29.97 kg/m    AROM @ 2125, moderate meconium CE 3/50/-2, somewhat more midposition but not a significant change TOCO q2-61mon pitocin 474mmin  Continue to titrate per protocol. Cat 1 tracing. Epidural when desired.

## 2021-07-29 NOTE — H&P (Signed)
Melody Gibbs is a 35 y.o. female presenting for schedule IOL. Busy labor floor therefore only called in around 1700. 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 Social History:  reports that she has never smoked. She has never used smokeless tobacco. She reports previous alcohol use. She reports that she does not use drugs.     Maternal Diabetes: No 1hr 73 Genetic Screening: Normal Maternal Ultrasounds/Referrals: Fetal Heart Anomaliesaxis deviation Fetal Ultrasounds or other Referrals:  Fetal echo WNL Maternal Substance Abuse:  No Significant Maternal Medications:  None Significant Maternal Lab Results:  Group B Strep negative Other Comments:  None  Review of Systems  Constitutional:  Negative for chills and fever.  Respiratory:  Negative for shortness of breath.   Cardiovascular:  Negative for chest pain, palpitations and leg swelling.  Gastrointestinal:  Negative for abdominal pain and vomiting.  Neurological:  Negative for dizziness, weakness and headaches.  Psychiatric/Behavioral:  Negative for suicidal ideas.   Maternal Medical History:  Contractions: Onset was 1-2 hours ago.   Frequency: rare.   Fetal activity: Perceived fetal activity is normal.   Prenatal complications: No bleeding or PIH.   Prenatal Complications - Diabetes: none.    Last menstrual period  10/14/2020. Exam Physical Exam Constitutional:      General: She is not in acute distress.    Appearance: She is well-developed.  HENT:     Head: Normocephalic and atraumatic.  Eyes:     Pupils: Pupils are equal, round, and reactive to light.  Cardiovascular:     Rate and Rhythm: Normal rate and regular rhythm.     Heart sounds: No murmur heard.   No gallop.  Abdominal:     Tenderness: There is no abdominal tenderness. There is no guarding or rebound.  Genitourinary:    Vagina: Normal.  Musculoskeletal:        General: Normal range of motion.     Cervical back: Normal range of motion and neck supple.  Skin:    General: Skin is warm and dry.  Neurological:     Mental Status: She is alert and oriented to person, place, and time.    Prenatal labs: ABO, Rh:  Opos Antibody:  neg Rubella:  imm RPR:   nr HBsAg:   neg HIV:   nr GBS:   NEG  Assessment/Plan: This is a 35yo @ 66 1/7 by LMP c/w 9wk scan admitted for IOL for late term. 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   Last GS on 7/15 was at 90th%tile, EFW 3870g, vtx. Outpatient  Foley bulb placed yesterday afternoon, displaced at 10PM yesterday. CE very anterior byt tight 3/50/-3 after FB. GBS neg, plan for AROm after epidural. Initiate pitocin and titrate per protocol  Melody Gibbs 07/29/2021, 5:17 PM

## 2021-07-30 ENCOUNTER — Encounter (HOSPITAL_COMMUNITY)
Admission: AD | Disposition: A | Discharge status: Home / Self Care | Payer: Self-pay | Source: Home / Self Care | Attending: Obstetrics and Gynecology

## 2021-07-30 ENCOUNTER — Inpatient Hospital Stay (HOSPITAL_COMMUNITY): Payer: No Typology Code available for payment source | Admitting: Anesthesiology

## 2021-07-30 ENCOUNTER — Encounter (HOSPITAL_COMMUNITY): Payer: Self-pay | Admitting: Obstetrics and Gynecology

## 2021-07-30 DIAGNOSIS — Z98891 History of uterine scar from previous surgery: Secondary | ICD-10-CM

## 2021-07-30 LAB — RPR: RPR Ser Ql: NONREACTIVE

## 2021-07-30 SURGERY — Surgical Case
Anesthesia: Epidural

## 2021-07-30 MED ORDER — IBUPROFEN 600 MG PO TABS
600.0000 mg | ORAL_TABLET | Freq: Four times a day (QID) | ORAL | Status: DC
  Administered 2021-07-30 – 2021-08-01 (×7): 600 mg via ORAL
  Filled 2021-07-30 (×7): qty 1

## 2021-07-30 MED ORDER — HYDROMORPHONE HCL 1 MG/ML IJ SOLN
INTRAMUSCULAR | Status: AC
  Filled 2021-07-30: qty 0.5

## 2021-07-30 MED ORDER — METHYLERGONOVINE MALEATE 0.2 MG/ML IJ SOLN
INTRAMUSCULAR | Status: DC | PRN
  Administered 2021-07-30: .2 mg via INTRAMUSCULAR

## 2021-07-30 MED ORDER — LACTATED RINGERS AMNIOINFUSION: INTRAVENOUS | Status: DC

## 2021-07-30 MED ORDER — SODIUM CHLORIDE 0.9% FLUSH: 3.0000 mL | INTRAVENOUS | Status: DC | PRN

## 2021-07-30 MED ORDER — ONDANSETRON HCL 4 MG/2ML IJ SOLN
INTRAMUSCULAR | Status: DC | PRN
Start: 2021-07-30 — End: 2021-07-30
  Administered 2021-07-30: 8 mg via INTRAVENOUS

## 2021-07-30 MED ORDER — OXYTOCIN-SODIUM CHLORIDE 30-0.9 UT/500ML-% IV SOLN
INTRAVENOUS | Status: AC
  Filled 2021-07-30: qty 500

## 2021-07-30 MED ORDER — DIPHENHYDRAMINE HCL 25 MG PO CAPS: 25.0000 mg | ORAL_CAPSULE | ORAL | Status: DC | PRN

## 2021-07-30 MED ORDER — METOCLOPRAMIDE HCL 5 MG/ML IJ SOLN
INTRAMUSCULAR | Status: DC | PRN
  Administered 2021-07-30: 10 mg via INTRAVENOUS

## 2021-07-30 MED ORDER — STERILE WATER FOR IRRIGATION IR SOLN
Status: DC | PRN
  Administered 2021-07-30: 1

## 2021-07-30 MED ORDER — DEXAMETHASONE SODIUM PHOSPHATE 10 MG/ML IJ SOLN
INTRAMUSCULAR | Status: AC
  Filled 2021-07-30: qty 1

## 2021-07-30 MED ORDER — MORPHINE SULFATE (PF) 0.5 MG/ML IJ SOLN
INTRAMUSCULAR | Status: DC | PRN
  Administered 2021-07-30: 3 mg via EPIDURAL

## 2021-07-30 MED ORDER — FENTANYL CITRATE (PF) 100 MCG/2ML IJ SOLN
INTRAMUSCULAR | Status: DC | PRN
  Administered 2021-07-30: 100 ug via EPIDURAL

## 2021-07-30 MED ORDER — LIDOCAINE-EPINEPHRINE (PF) 2 %-1:200000 IJ SOLN
INTRAMUSCULAR | Status: DC | PRN
  Administered 2021-07-30: 5 mL via EPIDURAL
  Administered 2021-07-30: 2 mL via EPIDURAL
  Administered 2021-07-30: 5 mL via EPIDURAL

## 2021-07-30 MED ORDER — LACTATED RINGERS IV SOLN: INTRAVENOUS | Status: DC | PRN

## 2021-07-30 MED ORDER — ACETAMINOPHEN 500 MG PO TABS
1000.0000 mg | ORAL_TABLET | Freq: Four times a day (QID) | ORAL | Status: DC
  Administered 2021-07-30 – 2021-08-01 (×6): 1000 mg via ORAL
  Filled 2021-07-30 (×7): qty 2

## 2021-07-30 MED ORDER — OXYCODONE HCL 5 MG PO TABS: 5.0000 mg | ORAL_TABLET | ORAL | Status: DC | PRN

## 2021-07-30 MED ORDER — SCOPOLAMINE 1 MG/3DAYS TD PT72
1.0000 | MEDICATED_PATCH | Freq: Once | TRANSDERMAL | Status: DC
  Administered 2021-07-30: 1.5 mg via TRANSDERMAL

## 2021-07-30 MED ORDER — TRIAMCINOLONE ACETONIDE 40 MG/ML IJ SUSP
INTRAMUSCULAR | Status: AC
  Filled 2021-07-30: qty 1

## 2021-07-30 MED ORDER — FENTANYL CITRATE (PF) 100 MCG/2ML IJ SOLN
INTRAMUSCULAR | Status: AC
  Filled 2021-07-30: qty 2

## 2021-07-30 MED ORDER — KETOROLAC TROMETHAMINE 30 MG/ML IJ SOLN
30.0000 mg | Freq: Once | INTRAMUSCULAR | Status: AC | PRN
  Administered 2021-07-30: 30 mg via INTRAVENOUS

## 2021-07-30 MED ORDER — CLINDAMYCIN PHOSPHATE 900 MG/50ML IV SOLN
900.0000 mg | Freq: Once | INTRAVENOUS | Status: AC
  Administered 2021-07-30: 900 mg via INTRAVENOUS

## 2021-07-30 MED ORDER — DIPHENHYDRAMINE HCL 50 MG/ML IJ SOLN: 12.5000 mg | INTRAMUSCULAR | Status: DC | PRN

## 2021-07-30 MED ORDER — DIBUCAINE (PERIANAL) 1 % EX OINT: 1.0000 "application " | TOPICAL_OINTMENT | CUTANEOUS | Status: DC | PRN

## 2021-07-30 MED ORDER — MEPERIDINE HCL 25 MG/ML IJ SOLN: 6.2500 mg | INTRAMUSCULAR | Status: DC | PRN

## 2021-07-30 MED ORDER — COCONUT OIL OIL: 1.0000 "application " | TOPICAL_OIL | Status: DC | PRN

## 2021-07-30 MED ORDER — NALOXONE HCL 0.4 MG/ML IJ SOLN: 0.4000 mg | INTRAMUSCULAR | Status: DC | PRN

## 2021-07-30 MED ORDER — OXYTOCIN-SODIUM CHLORIDE 30-0.9 UT/500ML-% IV SOLN: 2.5000 [IU]/h | INTRAVENOUS | Status: AC

## 2021-07-30 MED ORDER — CLINDAMYCIN PHOSPHATE 900 MG/50ML IV SOLN
INTRAVENOUS | Status: AC
  Filled 2021-07-30: qty 50

## 2021-07-30 MED ORDER — OXYCODONE HCL 5 MG PO TABS: 5.0000 mg | ORAL_TABLET | Freq: Once | ORAL | Status: DC | PRN

## 2021-07-30 MED ORDER — ACETAMINOPHEN 10 MG/ML IV SOLN
1000.0000 mg | Freq: Once | INTRAVENOUS | Status: AC
  Administered 2021-07-30: 1000 mg via INTRAVENOUS

## 2021-07-30 MED ORDER — ZOLPIDEM TARTRATE 5 MG PO TABS: 5.0000 mg | ORAL_TABLET | Freq: Every evening | ORAL | Status: DC | PRN

## 2021-07-30 MED ORDER — ONDANSETRON HCL 4 MG/2ML IJ SOLN
INTRAMUSCULAR | Status: AC
  Filled 2021-07-30: qty 4

## 2021-07-30 MED ORDER — DIPHENHYDRAMINE HCL 25 MG PO CAPS: 25.0000 mg | ORAL_CAPSULE | Freq: Four times a day (QID) | ORAL | Status: DC | PRN

## 2021-07-30 MED ORDER — PRENATAL MULTIVITAMIN CH
1.0000 | ORAL_TABLET | Freq: Every day | ORAL | Status: DC
  Administered 2021-07-31 – 2021-08-01 (×2): 1 via ORAL
  Filled 2021-07-30 (×2): qty 1

## 2021-07-30 MED ORDER — DEXAMETHASONE SODIUM PHOSPHATE 10 MG/ML IJ SOLN
INTRAMUSCULAR | Status: DC | PRN
  Administered 2021-07-30: 10 mg via INTRAVENOUS

## 2021-07-30 MED ORDER — KETOROLAC TROMETHAMINE 30 MG/ML IJ SOLN
INTRAMUSCULAR | Status: AC
  Filled 2021-07-30: qty 1

## 2021-07-30 MED ORDER — LIDOCAINE HCL (PF) 1 % IJ SOLN
INTRAMUSCULAR | Status: DC | PRN
  Administered 2021-07-30: 10 mL via EPIDURAL

## 2021-07-30 MED ORDER — TETANUS-DIPHTH-ACELL PERTUSSIS 5-2.5-18.5 LF-MCG/0.5 IM SUSY: 0.5000 mL | PREFILLED_SYRINGE | Freq: Once | INTRAMUSCULAR | Status: DC

## 2021-07-30 MED ORDER — TRIAMCINOLONE ACETONIDE 40 MG/ML IJ SUSP
INTRAMUSCULAR | Status: DC | PRN
  Administered 2021-07-30: 40 mg via INTRAMUSCULAR

## 2021-07-30 MED ORDER — SIMETHICONE 80 MG PO CHEW: 80.0000 mg | CHEWABLE_TABLET | ORAL | Status: DC | PRN

## 2021-07-30 MED ORDER — HYDROMORPHONE HCL 1 MG/ML IJ SOLN
0.2500 mg | INTRAMUSCULAR | Status: DC | PRN
  Administered 2021-07-30 (×2): 0.25 mg via INTRAVENOUS

## 2021-07-30 MED ORDER — NALOXONE HCL 4 MG/10ML IJ SOLN
1.0000 ug/kg/h | INTRAVENOUS | Status: DC | PRN
  Filled 2021-07-30: qty 5

## 2021-07-30 MED ORDER — MENTHOL 3 MG MT LOZG: 1.0000 | LOZENGE | OROMUCOSAL | Status: DC | PRN

## 2021-07-30 MED ORDER — WITCH HAZEL-GLYCERIN EX PADS: 1.0000 "application " | MEDICATED_PAD | CUTANEOUS | Status: DC | PRN

## 2021-07-30 MED ORDER — OXYCODONE HCL 5 MG/5ML PO SOLN: 5.0000 mg | Freq: Once | ORAL | Status: DC | PRN

## 2021-07-30 MED ORDER — OXYTOCIN-SODIUM CHLORIDE 30-0.9 UT/500ML-% IV SOLN
INTRAVENOUS | Status: DC | PRN
  Administered 2021-07-30: 30 [IU] via INTRAVENOUS

## 2021-07-30 MED ORDER — NALBUPHINE HCL 10 MG/ML IJ SOLN: 5.0000 mg | Freq: Once | INTRAMUSCULAR | Status: DC | PRN

## 2021-07-30 MED ORDER — SIMETHICONE 80 MG PO CHEW
80.0000 mg | CHEWABLE_TABLET | Freq: Three times a day (TID) | ORAL | Status: DC
  Administered 2021-07-31 – 2021-08-01 (×3): 80 mg via ORAL
  Filled 2021-07-30 (×4): qty 1

## 2021-07-30 MED ORDER — GENTAMICIN SULFATE 40 MG/ML IJ SOLN: 1.5000 mg/kg | Freq: Once | INTRAVENOUS | Status: DC

## 2021-07-30 MED ORDER — PROMETHAZINE HCL 25 MG/ML IJ SOLN: 6.2500 mg | INTRAMUSCULAR | Status: DC | PRN

## 2021-07-30 MED ORDER — SCOPOLAMINE 1 MG/3DAYS TD PT72
MEDICATED_PATCH | TRANSDERMAL | Status: AC
  Filled 2021-07-30: qty 1

## 2021-07-30 MED ORDER — ACETAMINOPHEN 10 MG/ML IV SOLN
INTRAVENOUS | Status: AC
  Filled 2021-07-30: qty 100

## 2021-07-30 MED ORDER — SENNOSIDES-DOCUSATE SODIUM 8.6-50 MG PO TABS
2.0000 | ORAL_TABLET | Freq: Every day | ORAL | Status: DC
  Administered 2021-07-31 – 2021-08-01 (×2): 2 via ORAL
  Filled 2021-07-30 (×2): qty 2

## 2021-07-30 MED ORDER — METHYLERGONOVINE MALEATE 0.2 MG/ML IJ SOLN
INTRAMUSCULAR | Status: AC
  Filled 2021-07-30: qty 1

## 2021-07-30 MED ORDER — MORPHINE SULFATE (PF) 0.5 MG/ML IJ SOLN
INTRAMUSCULAR | Status: AC
  Filled 2021-07-30: qty 10

## 2021-07-30 MED ORDER — TRIAMCINOLONE ACETONIDE 40 MG/ML IJ SUSP: 40.0000 mg | Freq: Once | INTRAMUSCULAR | Status: DC

## 2021-07-30 MED ORDER — SODIUM CHLORIDE 0.9 % IR SOLN
Status: DC | PRN
  Administered 2021-07-30: 1

## 2021-07-30 MED ORDER — NALBUPHINE HCL 10 MG/ML IJ SOLN
5.0000 mg | INTRAMUSCULAR | Status: DC | PRN
Start: 2021-07-30 — End: 2021-08-01

## 2021-07-30 MED ORDER — ONDANSETRON HCL 4 MG/2ML IJ SOLN: 4.0000 mg | Freq: Three times a day (TID) | INTRAMUSCULAR | Status: DC | PRN

## 2021-07-30 MED ORDER — GENTAMICIN SULFATE 40 MG/ML IJ SOLN
5.0000 mg/kg | INTRAVENOUS | Status: AC
  Administered 2021-07-30: 358.4 mg via INTRAVENOUS
  Filled 2021-07-30 (×2): qty 9

## 2021-07-30 SURGICAL SUPPLY — 35 items
APL SKNCLS STERI-STRIP NONHPOA (GAUZE/BANDAGES/DRESSINGS) ×1
BENZOIN TINCTURE PRP APPL 2/3 (GAUZE/BANDAGES/DRESSINGS) ×1 IMPLANT
CHLORAPREP W/TINT 26ML (MISCELLANEOUS) ×2 IMPLANT
CLAMP CORD UMBIL (MISCELLANEOUS) IMPLANT
CLOTH BEACON ORANGE TIMEOUT ST (SAFETY) ×2 IMPLANT
DRSG OPSITE POSTOP 4X10 (GAUZE/BANDAGES/DRESSINGS) ×2 IMPLANT
ELECT REM PT RETURN 9FT ADLT (ELECTROSURGICAL) ×2
ELECTRODE REM PT RTRN 9FT ADLT (ELECTROSURGICAL) ×1 IMPLANT
EXTRACTOR VACUUM KIWI (MISCELLANEOUS) IMPLANT
GLOVE BIO SURGEON STRL SZ 6.5 (GLOVE) ×2 IMPLANT
GLOVE BIOGEL PI IND STRL 7.0 (GLOVE) ×1 IMPLANT
GLOVE BIOGEL PI INDICATOR 7.0 (GLOVE) ×1
GOWN STRL REUS W/TWL LRG LVL3 (GOWN DISPOSABLE) ×4 IMPLANT
KIT ABG SYR 3ML LUER SLIP (SYRINGE) IMPLANT
NDL HYPO 25X5/8 SAFETYGLIDE (NEEDLE) IMPLANT
NEEDLE HYPO 25X5/8 SAFETYGLIDE (NEEDLE) IMPLANT
NS IRRIG 1000ML POUR BTL (IV SOLUTION) ×2 IMPLANT
PACK C SECTION WH (CUSTOM PROCEDURE TRAY) ×2 IMPLANT
PAD OB MATERNITY 4.3X12.25 (PERSONAL CARE ITEMS) ×2 IMPLANT
PENCIL SMOKE EVAC W/HOLSTER (ELECTROSURGICAL) ×2 IMPLANT
RTRCTR C-SECT PINK 25CM LRG (MISCELLANEOUS) ×2 IMPLANT
STRIP CLOSURE SKIN 1/2X4 (GAUZE/BANDAGES/DRESSINGS) ×1 IMPLANT
SUT CHROMIC 1 CTX 36 (SUTURE) ×4 IMPLANT
SUT PLAIN 0 NONE (SUTURE) IMPLANT
SUT PLAIN 2 0 XLH (SUTURE) ×2 IMPLANT
SUT VIC AB 0 CT1 27 (SUTURE) ×4
SUT VIC AB 0 CT1 27XBRD ANBCTR (SUTURE) ×2 IMPLANT
SUT VIC AB 2-0 CT1 27 (SUTURE) ×2
SUT VIC AB 2-0 CT1 TAPERPNT 27 (SUTURE) ×1 IMPLANT
SUT VIC AB 3-0 CT1 27 (SUTURE)
SUT VIC AB 3-0 CT1 TAPERPNT 27 (SUTURE) IMPLANT
SUT VIC AB 4-0 KS 27 (SUTURE) ×2 IMPLANT
TOWEL OR 17X24 6PK STRL BLUE (TOWEL DISPOSABLE) ×2 IMPLANT
TRAY FOLEY W/BAG SLVR 14FR LF (SET/KITS/TRAYS/PACK) ×2 IMPLANT
WATER STERILE IRR 1000ML POUR (IV SOLUTION) ×2 IMPLANT

## 2021-07-30 NOTE — Anesthesia Procedure Notes (Signed)
Epidural Patient location during procedure: OB Start time: 07/30/2021 1:57 AM End time: 07/30/2021 2:00 AM  Staffing Anesthesiologist: Lyn Hollingshead, MD Performed: anesthesiologist   Preanesthetic Checklist Completed: patient identified, IV checked, site marked, risks and benefits discussed, surgical consent, monitors and equipment checked, pre-op evaluation and timeout performed  Epidural Patient position: sitting Prep: DuraPrep and site prepped and draped Patient monitoring: continuous pulse ox and blood pressure Approach: midline Location: L3-L4 Injection technique: LOR air  Needle:  Needle type: Tuohy  Needle gauge: 17 G Needle length: 9 cm and 9 Needle insertion depth: 5 cm cm Catheter type: closed end flexible Catheter size: 19 Gauge Catheter at skin depth: 10 cm Test dose: negative and Other  Assessment Events: blood not aspirated, injection not painful, no injection resistance, no paresthesia and negative IV test  Additional Notes Reason for block:procedure for pain

## 2021-07-30 NOTE — Transfer of Care (Signed)
Immediate Anesthesia Transfer of Care Note  Patient: Melody Gibbs  Procedure(s) Performed: CESAREAN SECTION  Patient Location: PACU  Anesthesia Type:Epidural  Level of Consciousness: awake, alert  and oriented  Airway & Oxygen Therapy: Patient Spontanous Breathing  Post-op Assessment: Report given to RN and Post -op Vital signs reviewed and stable  Post vital signs: Reviewed and stable  Last Vitals:  Vitals Value Taken Time  BP 120/74 07/30/21 1815  Temp    Pulse 83 07/30/21 1821  Resp 23 07/30/21 1820  SpO2 96 % 07/30/21 1821  Vitals shown include unvalidated device data.  Last Pain:  Vitals:   07/30/21 1802  TempSrc: Oral  PainSc: 7       Patients Stated Pain Goal: 1 ([id number])  Complications: No notable events documented.

## 2021-07-30 NOTE — Anesthesia Preprocedure Evaluation (Signed)
Anesthesia Evaluation  Patient identified by MRN, date of birth, ID band Patient awake    Reviewed: Allergy & Precautions, H&P , Patient's Chart, lab work & pertinent test results  Airway Mallampati: I       Dental no notable dental hx.    Pulmonary neg pulmonary ROS,    Pulmonary exam normal        Cardiovascular negative cardio ROS Normal cardiovascular exam     Neuro/Psych negative neurological ROS  negative psych ROS   GI/Hepatic negative GI ROS, Neg liver ROS,   Endo/Other  negative endocrine ROS  Renal/GU negative Renal ROS  negative genitourinary   Musculoskeletal negative musculoskeletal ROS (+)   Abdominal Normal abdominal exam  (+)   Peds  Hematology negative hematology ROS (+)   Anesthesia Other Findings   Reproductive/Obstetrics (+) Pregnancy                             Anesthesia Physical Anesthesia Plan  ASA: 2  Anesthesia Plan: Epidural   Post-op Pain Management:    Induction:   PONV Risk Score and Plan:   Airway Management Planned:   Additional Equipment:   Intra-op Plan:   Post-operative Plan:   Informed Consent: I have reviewed the patients History and Physical, chart, labs and discussed the procedure including the risks, benefits and alternatives for the proposed anesthesia with the patient or authorized representative who has indicated his/her understanding and acceptance.       Plan Discussed with:   Anesthesia Plan Comments:         Anesthesia Quick Evaluation

## 2021-07-30 NOTE — Anesthesia Postprocedure Evaluation (Signed)
Anesthesia Post Note  Patient: Alizandra Canipe  Procedure(s) Performed: [place]     Patient location during evaluation: PACU Anesthesia Type: Epidural Level of consciousness: awake Pain management: pain level controlled Vital Signs Assessment: post-procedure vital signs reviewed and stable Respiratory status: spontaneous breathing Cardiovascular status: stable Postop Assessment: no headache, no backache, epidural receding, patient able to bend at knees and no apparent nausea or vomiting Anesthetic complications: no   No notable events documented.  Last Vitals:  Vitals:   07/30/21 1900 07/30/21 1915  BP: 129/64   Pulse: 78   Resp: (!) 23 20  Temp:    SpO2: 97%     Last Pain:  Vitals:   07/30/21 1846  TempSrc:   PainSc: 3    Pain Goal: Patients Stated Pain Goal: 1 (07/30/21 0103)              Epidural/Spinal Function Cutaneous sensation: Able to Wiggle Toes (07/30/21 1900), Patient able to flex knees: Yes (07/30/21 1900), Patient able to lift hips off bed: Yes (07/30/21 1900), Back pain beyond tenderness at insertion site: No (07/30/21 1900), Progressively worsening motor and/or sensory loss: No (07/30/21 1900), Bowel and/or bladder incontinence post epidural: No (07/30/21 1900)  Huston Foley

## 2021-07-30 NOTE — Progress Notes (Signed)
Comfortable, slept a little BP (!) 108/54   Pulse 67   Temp 98.3 F (36.8 C) (Oral)   Resp 16   Ht '5\' 7"'$  (1.702 m)   Wt 86.8 kg   LMP 10/14/2020 (Exact Date)   BMI 29.97 kg/m  CE 4/75/-2 (definite change in efface ment since last check) TOCO palpated q3-53mIUPC placed, pitocin at 12 mU/min  Cat 1 tracing - min to mod var , baseline 135, early decels, no accels  Continue to titrate until adequate

## 2021-07-30 NOTE — Progress Notes (Signed)
S/p epidural, comfortable  CE 4/60/-2, TOCO q2-70mon 881mmin Category 1-2 tracing, occ late after epidural placement now improving with position change  BP 120/66   Pulse 67   Temp 97.8 F (36.6 C)   Resp 17   Ht '5\' 7"'$  (1.702 m)   Wt 86.8 kg   LMP 10/14/2020 (Exact Date)   BMI 29.97 kg/m   Continue to titrate per protocol, Foley being placed at this time

## 2021-07-30 NOTE — Progress Notes (Signed)
Patient ID: Melody Gibbs, female   DOB: Jan 14, 1986, 35 y.o.   MRN: [id number] Pt comfortable with epidural Afeb VSS  Cervix 80/4/-1 at 845am  IUPC with adequate contractions for most part, spacing out occasionally but pitocin titrated up and now adequate  FHR category 1 overall with occasional mild variables  Will continue to follow progress. D/w pt possible LGA and would not be aggressive with perative vaginal delivery

## 2021-07-30 NOTE — Progress Notes (Signed)
Patient ID: Melody Gibbs, female   DOB: 1986-01-01, 35 y.o.   MRN: [id number] Pt still comfortable  Afeb vss FHR with intermittent mild variable decels, good variability and +accels  80-90/5/-1  IUPC with adequate contractions Variables improved s/p amnioinfusion, will hold infusion now Continue to follow progress

## 2021-07-30 NOTE — Op Note (Signed)
Operative Note    Preoperative Diagnosis Term pregnancy at 41 2/7 weeks Arrest of dilation Persistent category 2 tracing  Postoperative Diagnosis same  Procedure Primary low transverse c-section with two layer closure of uterus  Surgeon Paula Compton, MD  Anesthesia Epidural  Fluids: EBL 436m UOP 15524mIVF 100030mlearing urine  Findings A viable female infant in the vertex presentation Apgars 8,9 Weight pending  Uterus tubes and ovaries WNL.   Specimen Placenta to L&D  Procedure Note   Patient was taken to the operating room where epidural anesthesia was found to be adequate by Allis clamp test. She was prepped and draped in the normal sterile fashion in the dorsal supine position with a leftward tilt. An appropriate time out was performed. A Pfannenstiel skin incision was then made with the scalpel and carried through to the underlying layer of fascia by sharp dissection and Bovie cautery. The fascia was nicked in the midline and the incision was extended laterally with Mayo scissors. The inferior aspect of the incision was grasped Coker clamps and dissected off the underlying rectus muscles. In a similar fashion the superior aspect was dissected off the rectus muscles. Rectus muscles were separated in the midline and the peritoneal cavity entered bluntly. The peritoneal incision was then extended both superiorly and inferiorly with careful attention to avoid both bowel and bladder. The Alexis self-retaining wound retractor was then placed within the incision and the lower uterine segment exposed. The bladder flap was developed with Metzenbaum scissors and pushed away from the lower uterine segment. The lower uterine segment was then incised in a transverse fashion and the cavity itself entered bluntly. The incision was extended bluntly. The infant's head was then lifted and to the the incision, but could not be delivered with fundal pressure so the Kiwi vacuum was applied in  the green zone and the vertex delivered with one pop off.  Nuchal cord x 1 was reduced. The remainder of the infant delivered and the nose and mouth bulb suctioned with the cord clamped and cut as well. The infant was handed off to the waiting pediatricians. The placenta was then spontaneously expressed from the uterus and the uterus cleared of all clots and debris with moist lap sponge. The uterine incision was then repaired in 2 layers the first layer was a running locked layer 1-0 chromic and the second an imbricating layer of the same suture. The tubes and ovaries were inspected and the gutters cleared of all clots and debris. The uterine incision was inspected and found to be hemostatic. All instruments and sponges as well as the Alexis retractor were then removed from the abdomen. The rectus muscles and peritoneum were then reapproximated with a running suture of 2-0 Vicryl. The fascia was then closed with 0 Vicryl in a running fashion. Subcutaneous tissue was reapproximated with 3-0 plain in a running fashion. The skin was closed with a subcuticular stitch of 4-0 Vicryl on a Keith needle.  '40mg'$ /ml kenalog was then injected into the incision to hopefully prevent keloid formation.  The incision was then reinforced with benzoin and Steri-Strips. At the conclusion of the procedure all instruments and sponge counts were correct. Patient was taken to the recovery room in good condition with her baby accompanying her skin to skin.    D/w the patient and her husband circumcision and they desire.

## 2021-07-30 NOTE — Progress Notes (Signed)
Patient ID: Melody Gibbs, female   DOB: 1986-12-21, 35 y.o.   MRN: [id number]   Called to evaluate pt at about 300pm for lates, tachycardia and decreased variability, d/c pitocin and strip did gradually improve somewhat but now has decreased variability again and no accels and pitocin has remained off.   Cervix is stil 80/5/-1 to -2 with no substantial change.  D/w pt and husband in detail and given category 2 tracing is persisting despite pitocin being off with additional lack of cervical dilation I feel it is safest to proceed with c-section.  We discussed the risks and benefits of c-section in detail including bleeding, infection and possible damage to bowel and bladder.  We have discussed possible pp hemorrhage in setting of LGA baby and prolonged labor.  She has a h/o keloids and we will inject incision with kenalog to try to prevent.   Pt ready to proceed and OR opening.

## 2021-07-31 ENCOUNTER — Encounter (HOSPITAL_COMMUNITY): Payer: Self-pay | Admitting: Obstetrics and Gynecology

## 2021-07-31 LAB — CBC
HCT: 36.7 % (ref 36.0–46.0)
Hemoglobin: 12.6 g/dL (ref 12.0–15.0)
MCH: 29.9 pg (ref 26.0–34.0)
MCHC: 34.3 g/dL (ref 30.0–36.0)
MCV: 87.2 fL (ref 80.0–100.0)
Platelets: 185 10*3/uL (ref 150–400)
RBC: 4.21 MIL/uL (ref 3.87–5.11)
RDW: 13 % (ref 11.5–15.5)
WBC: 20.1 10*3/uL — ABNORMAL HIGH (ref 4.0–10.5)
nRBC: 0 % (ref 0.0–0.2)

## 2021-07-31 NOTE — Progress Notes (Signed)
Subjective: Postpartum Day 1: Cesarean Delivery Patient reports tolerating PO, + flatus, and no problems voiding.   Pain well controlled with ibuprofen. She is ambulating with no difficulty. Denies HA, SOB or CP. Bonding well with baby  Objective: Vital signs in last 24 hours: Temp:  [97.6 F (36.4 C)-99.4 F (37.4 C)] 98 F (36.7 C) (08/05 1341) Pulse Rate:  [60-94] 62 (08/05 1341) Resp:  [13-23] 18 (08/05 1341) BP: (105-129)/(64-77) 114/64 (08/05 1341) SpO2:  [95 %-99 %] 99 % (08/05 1341)  Physical Exam:  General: alert, cooperative, and no distress Lochia: appropriate Uterine Fundus: firm Incision: no significant drainage DVT Evaluation: No evidence of DVT seen on physical exam.  Recent Labs    07/29/21 1758 07/31/21 0506  HGB 13.0 12.6  HCT 37.8 36.7    Assessment/Plan: Status post Cesarean section. Doing well postoperatively.  Continue current care Circumcision tomorrow for baby Likely discharge home tomorrow.  Isaiah Serge 07/31/2021, 5:27 PM

## 2021-07-31 NOTE — Lactation Note (Signed)
This note was copied from a baby's chart. Lactation Consultation Note  Patient Name: Melody Gibbs [id number] Date: 07/31/2021 Reason for consult: Term;1st time breastfeeding Age:34 hours LC entered the room, mom had infant latched on her left breast in side lying position infant was latched with depth, infant BF for 10 minutes. Latch 10 Afterwards mom taught hand expression and infant was given 8 mls of colostrum by spoon. Afterwards dad was doing STS with infant when [facility] left the room.  LC discussed infant's input and output with parents. Mom made aware of O/P services, breastfeeding support groups, community resources, and our phone # for post-discharge questions.   Mom's plan: 1- Mom will continue to BF infant according to cues, 8 to 12+ or more  times with STS. 2- Mom knows to call RN or LC if she has any BF questions, concerns or need assistance with latch.  3- Mom knows infant is cluster feeding and mom can hand express after latching infant and give extra volume back by spoon. 4- Mom understands importance of maternal rest and stay hydrated.  Maternal Data Has patient been taught Hand Expression?: Yes Does the patient have breastfeeding experience prior to this delivery?: No  Feeding Mother's Current Feeding Choice: Breast Milk  LATCH Score Latch: Grasps breast easily, tongue down, lips flanged, rhythmical sucking.  Audible Swallowing: Spontaneous and intermittent  Type of Nipple: Everted at rest and after stimulation  Comfort (Breast/Nipple): Soft / non-tender  Hold (Positioning): No assistance needed to correctly position infant at breast.  LATCH Score: 10   Lactation Tools Discussed/Used    Interventions Interventions: Breast feeding basics reviewed;Assisted with latch;Skin to skin;Breast massage;Hand express;Breast compression;Expressed milk;Position options;Education  Discharge Pump: Personal (Per mom, she has Spectra 2 and Medela DEBP) WIC Program:  No  Consult Status Consult Status: Follow-up Date: 08/01/21 Follow-up type: In-patient    Vicente Serene 07/31/2021, 9:56 PM

## 2021-08-01 MED ORDER — OXYCODONE-ACETAMINOPHEN 5-325 MG PO TABS: 1.0000 | ORAL_TABLET | ORAL | 0 refills | Status: AC | PRN

## 2021-08-01 MED ORDER — IBUPROFEN 600 MG PO TABS: 600.0000 mg | ORAL_TABLET | Freq: Four times a day (QID) | ORAL | 1 refills | Status: DC | PRN

## 2021-08-01 NOTE — Discharge Instructions (Signed)
Call office with any concerns [phone] 

## 2021-08-01 NOTE — Progress Notes (Signed)
Subjective: Postpartum Day 2: Cesarean Delivery Patient reports tolerating PO, + flatus, and no problems voiding.  No changes overnight. Lochia mild. Denies HA, CP, SOB or fever. Ready for discharge to home today  Objective: Vital signs in last 24 hours: Temp:  [97.7 F (36.5 C)-98 F (36.7 C)] 98 F (36.7 C) (08/06 0507) Pulse Rate:  [56-74] 56 (08/06 0507) Resp:  [16-18] 18 (08/06 0507) BP: (95-114)/(56-78) 107/78 (08/06 0507) SpO2:  [99 %] 99 % (08/05 1341)  Physical Exam:  General: alert, cooperative, and no distress Lochia: appropriate Uterine Fundus: firm Incision: no significant drainage DVT Evaluation: No evidence of DVT seen on physical exam.  Recent Labs    07/29/21 1758 07/31/21 0506  HGB 13.0 12.6  HCT 37.8 36.7    Assessment/Plan: Status post Cesarean section. Doing well postoperatively.  Discharge home with standard precautions and return to clinic in 2 weeks for incision check and 6 week for postpartum visit .  Isaiah Serge 08/01/2021, 11:08 AM

## 2021-08-01 NOTE — Discharge Summary (Signed)
Postpartum Discharge Summary  Date of Service updated      Patient Name: Melody Gibbs DOB: 10/11/1986 MRN: [id number]  Date of admission: 07/29/2021 Delivery date:07/30/2021  Delivering provider: Paula Compton  Date of discharge: 08/01/2021  Admitting diagnosis: [redacted] weeks gestation of pregnancy [Z3A.41] S/P primary low transverse C-section [Z98.891] Intrauterine pregnancy: [redacted]w[redacted]d    Secondary diagnosis:  Active Problems:   [redacted] weeks gestation of pregnancy   S/P primary low transverse C-section  Additional problems: n/a    Discharge diagnosis: Term Pregnancy Delivered                                              Post partum procedures: n/a Augmentation: AROM, Pitocin, and OP Foley Complications: None  Hospital course: Induction of Labor With Cesarean Section   35y.o. yo G2P1011 at 444w2das admitted to the hospital 07/29/2021 for induction of labor. Patient had a labor course significant for category 2 tracing remote from delivery. The patient went for cesarean section due to Non-Reassuring FHR. Delivery details are as follows: Membrane Rupture Time/Date: 9:20 PM ,07/29/2021   Delivery Method:C-Section, Vacuum Assisted  Details of operation can be found in separate operative Note.  Patient had an uncomplicated postpartum course. She is ambulating, tolerating a regular diet, passing flatus, and urinating well.  Patient is discharged home in stable condition on 08/01/21.      Newborn Data: Birth date:07/30/2021  Birth time:5:11 PM  Gender:Female  Living status:Living  Apgars:8 ,9  Weight:4190 g                                Magnesium Sulfate received: No BMZ received: No  Physical exam  Vitals:   07/31/21 0943 07/31/21 1341 07/31/21 1950 08/01/21 0507  BP: 108/69 114/64 (!) 95/56 107/78  Pulse: 63 62 74 (!) 56  Resp: _0 Temp: 97.9 F (36.6 C) 98 F (36.7 C) 97.7 F (36.5 C) 98 F (36.7 C)  TempSrc: Oral Oral Oral Oral  SpO2: 98% 99%    Weight:      Height:        General: alert, cooperative, and no distress Lochia: appropriate Uterine Fundus: firm Incision: Dressing is clean, dry, and intact DVT Evaluation: No evidence of DVT seen on physical exam. Labs: Lab Results  Component Value Date   WBC 20.1 (H) 07/31/2021   HGB 12.6 07/31/2021   HCT 36.7 07/31/2021   MCV 87.2 07/31/2021   PLT 185 07/31/2021   CMP Latest Ref Rng & Units 04/01/2020  Glucose 65 - 99 mg/dL 66  BUN 6 - 20 mg/dL 13  Creatinine 0.57 - 1.00 mg/dL 0.75  Sodium 134 - 144 mmol/L 141  Potassium 3.5 - 5.2 mmol/L 4.3  Chloride 96 - 106 mmol/L 103  CO2 20 - 29 mmol/L 23  Calcium 8.7 - 10.2 mg/dL 9.2   Edinburgh Score: Edinburgh Postnatal Depression Scale Screening Tool 07/31/2021  I have been able to laugh and see the funny side of things. 0  I have looked forward with enjoyment to things. 0  I have blamed myself unnecessarily when things went wrong. 1  I have been anxious or worried for no good reason. 1  I have felt scared or panicky for no good reason. 0  Things have  been getting on top of me. 1  I have been so unhappy that I have had difficulty sleeping. 0  I have felt sad or miserable. 0  I have been so unhappy that I have been crying. 0  The thought of harming myself has occurred to me. 0  Edinburgh Postnatal Depression Scale Total 3      After visit meds:  Allergies as of 08/01/2021       Reactions   Penicillins    As a child        Medication List     STOP taking these medications    COVID-19 Specimen Collection Kit       TAKE these medications    docusate sodium 100 MG capsule Commonly known as: COLACE Take 100 mg by mouth daily.   ibuprofen 600 MG tablet Commonly known as: ADVIL Take 1 tablet (600 mg total) by mouth every 6 (six) hours as needed.   oxyCODONE-acetaminophen 5-325 MG tablet Commonly known as: Percocet Take 1 tablet by mouth every 4 (four) hours as needed for up to 7 days for severe pain.   PRENATAL VITAMIN PO Take by  mouth.         Discharge home in stable condition Infant Feeding: Breast Infant Disposition:home with mother Discharge instruction: per After Visit Summary and Postpartum booklet. Activity: Advance as tolerated. Pelvic rest for 6 weeks.  Diet: routine diet Anticipated Birth Control: Unsure Postpartum Appointment:6 weeks Additional Postpartum F/U: Incision check 2 weeks Future Appointments:No future appointments. Follow up Visit:  Follow-up Information     Paula Compton, MD. Schedule an appointment as soon as possible for a visit.   Specialty: Obstetrics and Gynecology Why: 2 weeks for incision check and 6 weeks for postpartum visit Contact information: [address] [place] [address] [phone]                     08/01/2021 Isaiah Serge, DO

## 2021-08-01 NOTE — Lactation Note (Signed)
This note was copied from a baby's chart. Lactation Consultation Note  Patient Name: Melody Gibbs [id number] Date: 08/01/2021 Reason for consult: Follow-up assessment;Primapara;1st time breastfeeding;Term Age:34 hours  P1 mother whose infant is now 42 hours old.  This is a term baby at 41+2 weeks.  Baby was swaddled and asleep in the bassinet when I arrived.  Mother had no questions/concerns related to breast feeding.  She is familiar with hand expression and is able to express colostrum.  Mother will continue to feed on cue or at least 8-12 times/24 hours. Baby is voiding/stooling well.  She has our OP phone number for any questions after discharge. Mother has placed her order for her employee pump and is awaiting the arrival within the next few days.  Father and support person present.   Maternal Data Has patient been taught Hand Expression?: Yes Does the patient have breastfeeding experience prior to this delivery?: No  Feeding Mother's Current Feeding Choice: Breast Milk  LATCH Score                    Lactation Tools Discussed/Used    Interventions Interventions: Education  Discharge Discharge Education: Engorgement and breast care Pump: Personal WIC Program: No  Consult Status Consult Status: Complete Date: 08/01/21 Follow-up type: Call as needed    Taahir Grisby R Jackob [place] 08/01/2021, 9:52 AM

## 2021-08-13 ENCOUNTER — Telehealth (HOSPITAL_COMMUNITY): Payer: Self-pay | Admitting: *Deleted

## 2021-08-13 NOTE — Telephone Encounter (Signed)
Mom reports doing well. Incision is healing well. Has been to recheck appt with OB. No concerns about herself. EPDS=2 [facility] score = 3) Mom reports baby is well. Eating, peeing, and pooping well. Sleeps in bassinet in mom's room on back. No concerns about baby.  Odis Hollingshead, RN 08-13-2021 at 11:17am

## 2022-02-24 ENCOUNTER — Ambulatory Visit (INDEPENDENT_AMBULATORY_CARE_PROVIDER_SITE_OTHER): Payer: No Typology Code available for payment source | Admitting: Family Medicine

## 2022-02-24 ENCOUNTER — Encounter: Payer: Self-pay | Admitting: Family Medicine

## 2022-02-24 VITALS — BP 106/70 | HR 63 | Temp 97.3°F | Resp 16 | Ht 66.5 in | Wt 154.0 lb

## 2022-02-24 DIAGNOSIS — Z0000 Encounter for general adult medical examination without abnormal findings: Secondary | ICD-10-CM

## 2022-02-24 DIAGNOSIS — D485 Neoplasm of uncertain behavior of skin: Secondary | ICD-10-CM | POA: Insufficient documentation

## 2022-02-24 DIAGNOSIS — D2370 Other benign neoplasm of skin of unspecified lower limb, including hip: Secondary | ICD-10-CM | POA: Insufficient documentation

## 2022-02-24 DIAGNOSIS — R5383 Other fatigue: Secondary | ICD-10-CM | POA: Diagnosis not present

## 2022-02-24 DIAGNOSIS — R7989 Other specified abnormal findings of blood chemistry: Secondary | ICD-10-CM | POA: Diagnosis not present

## 2022-02-24 DIAGNOSIS — Z13220 Encounter for screening for lipoid disorders: Secondary | ICD-10-CM | POA: Diagnosis not present

## 2022-02-24 DIAGNOSIS — D225 Melanocytic nevi of trunk: Secondary | ICD-10-CM | POA: Insufficient documentation

## 2022-02-24 LAB — HEPATIC FUNCTION PANEL
ALT: 15 U/L (ref 0–35)
AST: 38 U/L — ABNORMAL HIGH (ref 0–37)
Albumin: 4.7 g/dL (ref 3.5–5.2)
Alkaline Phosphatase: 80 U/L (ref 39–117)
Bilirubin, Direct: 0.1 mg/dL (ref 0.0–0.3)
Total Bilirubin: 0.7 mg/dL (ref 0.2–1.2)
Total Protein: 7.4 g/dL (ref 6.0–8.3)

## 2022-02-24 LAB — CBC WITH DIFFERENTIAL/PLATELET
Basophils Absolute: 0.1 10*3/uL (ref 0.0–0.1)
Basophils Relative: 1.2 % (ref 0.0–3.0)
Eosinophils Absolute: 0.3 10*3/uL (ref 0.0–0.7)
Eosinophils Relative: 6.5 % — ABNORMAL HIGH (ref 0.0–5.0)
HCT: 39.8 % (ref 36.0–46.0)
Hemoglobin: 13.4 g/dL (ref 12.0–15.0)
Lymphocytes Relative: 38 % (ref 12.0–46.0)
Lymphs Abs: 1.7 10*3/uL (ref 0.7–4.0)
MCHC: 33.7 g/dL (ref 30.0–36.0)
MCV: 84.4 fl (ref 78.0–100.0)
Monocytes Absolute: 0.3 10*3/uL (ref 0.1–1.0)
Monocytes Relative: 7.2 % (ref 3.0–12.0)
Neutro Abs: 2.1 10*3/uL (ref 1.4–7.7)
Neutrophils Relative %: 47.1 % (ref 43.0–77.0)
Platelets: 249 10*3/uL (ref 150.0–400.0)
RBC: 4.71 Mil/uL (ref 3.87–5.11)
RDW: 17.5 % — ABNORMAL HIGH (ref 11.5–15.5)
WBC: 4.5 10*3/uL (ref 4.0–10.5)

## 2022-02-24 LAB — BASIC METABOLIC PANEL
BUN: 14 mg/dL (ref 6–23)
CO2: 30 mEq/L (ref 19–32)
Calcium: 8.8 mg/dL (ref 8.4–10.5)
Chloride: 100 mEq/L (ref 96–112)
Creatinine, Ser: 1.18 mg/dL (ref 0.40–1.20)
GFR: 60.21 mL/min (ref >60.00)
Glucose, Bld: 76 mg/dL (ref 70–99)
Potassium: 4 mEq/L (ref 3.5–5.1)
Sodium: 138 mEq/L (ref 135–145)

## 2022-02-24 LAB — LIPID PANEL
Cholesterol: 198 mg/dL (ref 0–200)
HDL: 68.5 mg/dL (ref >39.00)
LDL Cholesterol: 119 mg/dL — ABNORMAL HIGH (ref 0–99)
NonHDL: 129.1
Total CHOL/HDL Ratio: 3
Triglycerides: 49 mg/dL (ref 0.0–149.0)
VLDL: 9.8 mg/dL (ref 0.0–40.0)

## 2022-02-24 LAB — VITAMIN D 25 HYDROXY (VIT D DEFICIENCY, FRACTURES): VITD: 35.52 ng/mL (ref 30.00–100.00)

## 2022-02-24 LAB — TSH: TSH: 134.05 u[IU]/mL — ABNORMAL HIGH (ref 0.35–5.50)

## 2022-02-24 NOTE — Assessment & Plan Note (Signed)
Pt's PE WNL.  UTD on pap, immunizations.  Check labs.  Anticipatory guidance provided.  ?

## 2022-02-24 NOTE — Progress Notes (Signed)
? ?  Subjective:  ? ? Patient ID: Melody Gibbs, female    DOB: September 01, 1986, 36 y.o.   MRN: [id number] ? ?HPI ?CPE- UTD on pap w/ GYN.  UTD on Tdap, flu.   ? ?Health Maintenance  ?Topic Date Due  ? COVID-19 Vaccine (2 - Janssen risk series) 08/20/2020  ? PAP SMEAR-Modifier  04/10/2021  ? Hepatitis C Screening  03/13/2022 (Originally 07/05/2005)  ? TETANUS/TDAP  04/15/2025  ? INFLUENZA VACCINE  Completed  ? HIV Screening  Completed  ? HPV VACCINES  Aged Out  ?  ? ? ?Review of Systems ?Patient reports no vision/ hearing changes, adenopathy,fever, weight change,  persistant/recurrent hoarseness , swallowing issues, chest pain, palpitations, edema, persistant/recurrent cough, hemoptysis, dyspnea (rest/exertional/paroxysmal nocturnal), gastrointestinal bleeding (melena, rectal bleeding), abdominal pain, significant heartburn, bowel changes, GU symptoms (dysuria, hematuria, incontinence), Gyn symptoms (abnormal  bleeding, pain),  syncope, focal weakness, memory loss, numbness & tingling, skin/hair/nail changes, abnormal bruising or bleeding, anxiety, or depression.  ? ?+ fatigue ? ?This visit occurred during the SARS-CoV-2 public health emergency.  Safety protocols were in place, including screening questions prior to the visit, additional usage of staff PPE, and extensive cleaning of exam room while observing appropriate contact time as indicated for disinfecting solutions.   ?   ?Objective:  ? Physical Exam ?General Appearance:    Alert, cooperative, no distress, appears stated age  ?Head:    Normocephalic, without obvious abnormality, atraumatic  ?Eyes:    PERRL, conjunctiva/corneas clear, EOM's intact, fundi  ?  benign, both eyes  ?Ears:    Normal TM's and external ear canals, both ears  ?Nose:   Deferred due to COVID  ?Throat:   ?Neck:   Supple, symmetrical, trachea midline, no adenopathy;  ?  Thyroid: no enlargement/tenderness/nodules  ?Back:     Symmetric, no curvature, ROM normal, no CVA tenderness  ?Lungs:     Clear to  auscultation bilaterally, respirations unlabored  ?Chest Wall:    No tenderness or deformity  ? Heart:    Regular rate and rhythm, S1 and S2 normal, no murmur, rub ?  or gallop  ?Breast Exam:    Deferred to GYN  ?Abdomen:     Soft, non-tender, bowel sounds active all four quadrants,  ?  no masses, no organomegaly  ?Genitalia:    Deferred to GYN  ?Rectal:    ?Extremities:   Extremities normal, atraumatic, no cyanosis or edema  ?Pulses:   2+ and symmetric all extremities  ?Skin:   Skin color, texture, turgor normal, no rashes or lesions  ?Lymph nodes:   Cervical, supraclavicular, and axillary nodes normal  ?Neurologic:   CNII-XII intact, normal strength, sensation and reflexes  ?  throughout  ?  ? ? ? ?   ?Assessment & Plan:  ? ? ?

## 2022-02-24 NOTE — Patient Instructions (Signed)
Follow up in 1 year or as needed We'll notify you of your lab results and make any changes if needed Keep up the good work on healthy diet and regular exercise- you look great!!! Call with any questions or concerns Stay Safe!  Stay Healthy! Happy Spring!!! 

## 2022-02-25 ENCOUNTER — Telehealth: Payer: Self-pay | Admitting: Family Medicine

## 2022-02-25 ENCOUNTER — Other Ambulatory Visit: Payer: Self-pay

## 2022-02-25 ENCOUNTER — Other Ambulatory Visit (INDEPENDENT_AMBULATORY_CARE_PROVIDER_SITE_OTHER): Payer: No Typology Code available for payment source

## 2022-02-25 ENCOUNTER — Telehealth: Payer: Self-pay

## 2022-02-25 ENCOUNTER — Other Ambulatory Visit (HOSPITAL_COMMUNITY): Payer: Self-pay

## 2022-02-25 DIAGNOSIS — R7989 Other specified abnormal findings of blood chemistry: Secondary | ICD-10-CM

## 2022-02-25 LAB — TSH: TSH: 131.07 u[IU]/mL — ABNORMAL HIGH (ref 0.35–5.50)

## 2022-02-25 LAB — T4, FREE: Free T4: 0.13 ng/dL — ABNORMAL LOW (ref 0.60–1.60)

## 2022-02-25 LAB — T3, FREE: T3, Free: 2 pg/mL — ABNORMAL LOW (ref 2.3–4.2)

## 2022-02-25 MED ORDER — LEVOTHYROXINE SODIUM 50 MCG PO TABS
50.0000 ug | ORAL_TABLET | Freq: Every day | ORAL | 0 refills | Status: DC
  Filled 2022-02-25: qty 90, 90d supply, fill #0

## 2022-02-25 NOTE — Telephone Encounter (Signed)
Patient wanting to hold on the medication for now ?

## 2022-02-25 NOTE — Telephone Encounter (Signed)
Caller name:Raeven Nitta  ? ?On DPR? :Yes ? ?Call back [phone] ? ?Provider they see: Birdie Riddle  ? ?Reason for call:Pt wants to speak with clinical regarding her labs she does not want to be put on meds if her labs are normal except for this last time and wants to know would it be better to have blood work redone before being put on meds  ? ?

## 2022-02-25 NOTE — Telephone Encounter (Signed)
Patient is scheduled today to come in and redraw the TSH, she is open to the medication but wants to see if there was an error in the first results since it jumped so much from last year. Patient wanted to know what could have contributed to that? Anything postpartum, viral, etc? I let patient know that I would ask and get back with her via my chart ?

## 2022-02-25 NOTE — Telephone Encounter (Signed)
We can certainly redraw the TSH and assess the free T3 and free T4 to make sure there wasn't an error.  If the readings are again elevated, I would definitely recommend medication.  You can develop a condition called post-partum thyroiditis which can impact thyroid levels/function.  Typically in that condition the thyroid becomes overactive and can then burnout and under function (where your labs indicate you are).  You can also have viral thyroiditis that does the same thing.  We will hold on meds until we can confirm accurate values. ?

## 2022-02-25 NOTE — Telephone Encounter (Signed)
Pt needs the Levothyroxine 64mcg to go to [facility] health outpt pharm at [facility].  ? ?She is changing her pharmacy due to insurance  ? ? ?

## 2022-02-25 NOTE — Telephone Encounter (Signed)
Patient has been contacted via phone ?

## 2022-02-25 NOTE — Telephone Encounter (Signed)
Communicated via mychart

## 2022-02-26 ENCOUNTER — Encounter: Payer: Self-pay | Admitting: Family Medicine

## 2022-02-26 DIAGNOSIS — R7989 Other specified abnormal findings of blood chemistry: Secondary | ICD-10-CM

## 2022-03-10 NOTE — Progress Notes (Signed)
Patient ID: Melody Gibbs, female   DOB: 1986-07-18, 36 y.o.   MRN: [id number] ? ?This visit occurred during the SARS-CoV-2 public health emergency.  Safety protocols were in place, including screening questions prior to the visit, additional usage of staff PPE, and extensive cleaning of exam room while observing appropriate contact time as indicated for disinfecting solutions.  ? ?HPI  ?Melody Gibbs is a 36 y.o.-year-old female, referred by her PCP, Dr. Birdie Riddle, for management of hypothyroidism.  Her step father, Melody Gibbs, is also my patient. ? ?Patient describes that early last month she had an upper respiratory infection.  She was very slow to recover from this and remains very fatigued.  She went and saw her PCP on 02/24/2022 and the TSH was checked at that time.  This returns very elevated.  She had a repeat TSH along with free T4 and free T3 next day and they were all abnormal, in the hypothyroid range.   ? ?She had a recent pregnancy (no recent TSH levels from during the pregnancy), during which she gained a little bit more than 40 pounds.  She gave birth on 07/30/2021. Her son was larger for age (9.5 lbs) but healthy.  She was able to lose a significant amount of weight fast afterwards.  She is breast-feeding.  She does mention that she had fluctuations of mood and energy since she gave birth. ? 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  She still feels tired, but can keep up with work and she also continues to do intense exercise 2x a week, previously more frequently.  ? ?She started LT4 50 mcg daily (02/2022): ?- fasting ?- with water ?- separated by >30 min from b'fast  ?- no calcium, iron, PPIs ?- + prenatal multivitamins  ? ?I reviewed pt's thyroid tests: ?Lab Results  ?Component Value Date  ? TSH 131.07 (H) 02/25/2022  ? TSH 134.05 (H) 02/24/2022  ? TSH 4.380 04/01/2020  ? TSH 3.40 06/28/2013  ? TSH 5.09 12/31/2011  ? FREET4 0.13 (L) 02/25/2022  ? FREET4 0.79 06/28/2013  ? FREET4 0.82  12/31/2011  ? T3FREE 2.0 (L) 02/25/2022  ? ?Antithyroid antibodies: ?No results found for: THGAB ?No components found for: TPOAB ? ?Pt also describes: ?- cold intolerance ? ?But no: ?- depression/anxiety ?- constipation ?- dry skin ?- hair loss ? ?In 2021, she had palpitations >> a TSH was checked at that time and it was normal.  ? ?Pt denies feeling nodules in neck, dysphagia/odynophagia, SOB with lying down.  She does have hoarseness. ? ?She has + FH of thyroid disorders: biological MGM with hypothyroidsm. No FH of thyroid cancer. No FH of AI ds. ?No h/o radiation tx to head or neck. ?No recent use of iodine supplements. ? ?ROS: ?Constitutional: + see HPI ?Eyes: no blurry vision, no xerophthalmia ?ENT: no sore throat, + see HPI ?Cardiovascular: no CP/SOB/palpitations/leg swelling ?Respiratory: no cough/SOB ?Gastrointestinal: no N/V/D/C ?Musculoskeletal: no muscle/joint aches ?Skin: no rashes ?Neurological: no tremors/numbness/tingling/dizziness ?Psychiatric: no depression/anxiety ? ?Past Medical History:  ?Diagnosis Date  ? Abnormal TSH   ? 5.9 in 2012  ? ?Past Surgical History:  ?Procedure Laterality Date  ? CESAREAN SECTION N/A 07/30/2021  ? Procedure: CESAREAN SECTION;  Surgeon: Paula Compton, MD;  Location: [facility] LD ORS;  Service: Obstetrics;  Laterality: N/A;  ? DILATION AND CURETTAGE OF UTERUS    ? WISDOM TOOTH EXTRACTION    ? ?Social History  ? ?Socioeconomic History  ? Marital status: Married  ?  Spouse name: Not on file  ? Number of children: 1  ? Years of education: Not on file  ? Highest education level: Not on file  ?Occupational History  ? Occupation: [employer]  ?  Comment: Nurse  ? Occupation: in [employer]  ?Tobacco Use  ? Smoking status: Never  ? Smokeless tobacco: Never  ?Vaping Use  ? Vaping Use: Never used  ?Substance and Sexual Activity  ? Alcohol use: Not Currently  ?  Comment: occ  ? Drug use: No  ? Sexual activity: Not on file  ?Other Topics Concern  ? Not on file  ?Social History  Narrative  ? ** Merged History Encounter **  ?    ? Education:  Copywriter, advertising from [place] ?Biology major at [employer] in nurse anesthesia 2019 ?Frequent exercise ?Enjoys photography ?Married 2019 ?  ? ?Social Determinants of Health  ? ?Financial Resource Strain: Not on file  ?Food Insecurity: Not on file  ?Transportation Needs: Not on file  ?Physical Activity: Not on file  ?Stress: Not on file  ?Social Connections: Not on file  ?Intimate Partner Violence: Not on file  ? ?Current Outpatient Medications on File Prior to Visit  ?Medication Sig Dispense Refill  ? levothyroxine (SYNTHROID) 50 MCG tablet Take 1 tablet (50 mcg total) by mouth daily. 90 tablet 0  ? Prenatal Vit-Fe Fumarate-FA (PRENATAL VITAMIN PO) Take by mouth.    ? ?No current facility-administered medications on file prior to visit.  ? ?Allergies  ?Allergen Reactions  ? Penicillins   ?  As a child  ? ?Family History  ?Problem Relation Age of Onset  ? Hypertension Father   ? Hypertension Other   ? Hyperlipidemia Other   ? Arthritis Other   ? Dementia Other   ? Colon cancer Neg Hx   ? Breast cancer Neg Hx   ? ? ?PE: ?BP 114/72 (BP Location: Right Arm, Patient Position: Sitting, Cuff Size: Normal)   Pulse 66   Ht 5' 6.5" (1.689 m)   Wt 153 lb 3.2 oz (69.5 kg)   SpO2 98%   BMI 24.36 kg/m?  ?Wt Readings from Last 3 Encounters:  ?03/11/22 153 lb 3.2 oz (69.5 kg)  ?02/24/22 154 lb (69.9 kg)  ?07/29/21 191 lb 5.8 oz (86.8 kg)  ? ?Constitutional: normal weight, in NAD ?Eyes: PERRLA, EOMI, no exophthalmos ?ENT: moist mucous membranes, +Symmetrically Enlarged, nontender, nonnodular, thyroid, no cervical lymphadenopathy ?Cardiovascular: RRR, No MRG ?Respiratory: CTA B ?Musculoskeletal: no deformities, strength intact in all 4 ?Skin: moist, warm, no rashes ?Neurological: no tremor with outstretched hands, DTR normal in all 4 ? ?ASSESSMENT: ?1.  New diagnosis of hypothyroidism ? ?PLAN:  ?1. Patient with new diagnosis of hypothyroidism 2 weeks ago, when a TSH returned  very high, 134.  This was confirmed next day at 131.  Free T4 and free T3 levels were low. ?- Dr. Birdie Riddle recommended to start levothyroxine 50 mcg daily, whichPatient started approximately 2 weeks ago.  She initially felt slightly better, but as of now, she does not feel that it is helping much. ?- at this visit we discussed that it is possible that her hypothyroidism was precipitated by pregnancy due to preexisting Hashimoto's thyroiditis.  I explained that high estrogen levels will increase the thyroid binding globulin and the thyroid needs to produce more hormones to maintain the free thyroid levels in blood.  If patients have a slight impairment in the thyroid function, it can become manifest during pregnancy or during treatment  with oral contraceptives or any other situation which may increase estrogen.  Alternatively, her hypothyroidism may be a late phase of postpartum or postviral thyroiditis. ?- at this visit we also discussed about possible consequences of uncontrolled hypothyroidism including but not limited to significant fatigue, weight gain, cold intolerance, depression, fluid retention, possible pericarditis, CHF, hypertension, dementia. ?-  At today's visit, I recommended yo increase her LT4 dose to 75 mcg daily which can be modified after we repeat her labs in 5-6 weeks. ?- she Would like to try for another pregnancy.  I recommended to normalize her TFTs first and we discussed about management of LT4 during pregnancy  ?- We discussed about correct intake of levothyroxine, fasting, with water, separated by at least 30 minutes from breakfast, and separated by more than 4 hours from calcium, iron, multivitamins, acid reflux medications (PPIs). She  is taking it correctly. ?- with the next set of labs, I would like to check her antithyroid antibodies to screen for Hashimoto's thyroiditis ?- I will see her back in 3 months, but she will return in 5-6 weeks for labs. ? ?Orders Placed This Encounter   ?Procedures  ? TSH  ? T4, free  ? T3, free  ? Thyroid peroxidase antibody  ? Thyroglobulin antibody  ? ?Philemon Kingdom, MD PhD ?[facility] ? ? ?

## 2022-03-11 ENCOUNTER — Encounter: Payer: Self-pay | Admitting: Internal Medicine

## 2022-03-11 ENCOUNTER — Other Ambulatory Visit: Payer: Self-pay

## 2022-03-11 ENCOUNTER — Ambulatory Visit: Payer: No Typology Code available for payment source | Admitting: Internal Medicine

## 2022-03-11 VITALS — BP 114/72 | HR 66 | Ht 66.5 in | Wt 153.2 lb

## 2022-03-11 DIAGNOSIS — E039 Hypothyroidism, unspecified: Secondary | ICD-10-CM | POA: Diagnosis not present

## 2022-03-11 MED ORDER — LEVOTHYROXINE SODIUM 75 MCG PO TABS: 75.0000 ug | ORAL_TABLET | Freq: Every day | ORAL | 3 refills | Status: DC

## 2022-03-11 NOTE — Patient Instructions (Addendum)
Please increase Levothyroxine to 75 mcg daily. ? ?Take the thyroid hormone every day, with water, at least 30 minutes before breakfast, separated by at least 4 hours from: ?- acid reflux medications ?- calcium ?- iron ?- multivitamins ? ?Please come back for labs in 5-6 weeks. ? ?Please come back for a follow-up appointment in 3 months. ? ?Hypothyroidism ?Hypothyroidism is when the thyroid gland does not make enough of certain hormones (it is underactive). The thyroid gland is a small gland located in the lower front part of the neck, just in front of the windpipe (trachea). This gland makes hormones that help control how the body uses food for energy (metabolism) as well as how the heart and brain function. These hormones also play a role in keeping your bones strong. When the thyroid is underactive, it produces too little of the hormones thyroxine (T4) and triiodothyronine (T3). ?What are the causes? ?This condition may be caused by: ?Hashimoto's disease. This is a disease in which the body's disease-fighting system (immune system) attacks the thyroid gland. This is the most common cause. ?Viral infections. ?Pregnancy. ?Certain medicines. ?Birth defects. ?Past radiation treatments to the head or neck for cancer. ?Past treatment with radioactive iodine. ?Past exposure to radiation in the environment. ?Past surgical removal of part or all of the thyroid. ?Problems with a gland in the center of the brain (pituitary gland). ?Lack of enough iodine in the diet. ?What increases the risk? ?You are more likely to develop this condition if: ?You are female. ?You have a family history of thyroid conditions. ?You use a medicine called lithium. ?You take medicines that affect the immune system (immunosuppressants). ?What are the signs or symptoms? ?Symptoms of this condition include: ?Feeling as though you have no energy (lethargy). ?Not being able to tolerate cold. ?Weight gain that is not explained by a change in diet or  exercise habits. ?Lack of appetite. ?Dry skin. ?Coarse hair. ?Menstrual irregularity. ?Slowing of thought processes. ?Constipation. ?Sadness or depression. ?How is this diagnosed? ?This condition may be diagnosed based on: ?Your symptoms, your medical history, and a physical exam. ?Blood tests. ?You may also have imaging tests, such as an ultrasound or MRI. ?How is this treated? ?This condition is treated with medicine that replaces the thyroid hormones that your body does not make. After you begin treatment, it may take several weeks for symptoms to go away. ?Follow these instructions at home: ?Take over-the-counter and prescription medicines only as told by your health care provider. ?If you start taking any new medicines, tell your health care provider. ?Keep all follow-up visits as told by your health care provider. This is important. ?As your condition improves, your dosage of thyroid hormone medicine may change. ?You will need to have blood tests regularly so that your health care provider can monitor your condition. ?Contact a health care provider if: ?Your symptoms do not get better with treatment. ?You are taking thyroid hormone replacement medicine and you: ?Sweat a lot. ?Have tremors. ?Feel anxious. ?Lose weight rapidly. ?Cannot tolerate heat. ?Have emotional swings. ?Have diarrhea. ?Feel weak. ?Get help right away if you have: ?Chest pain. ?An irregular heartbeat. ?A rapid heartbeat. ?Difficulty breathing. ?Summary ?Hypothyroidism is when the thyroid gland does not make enough of certain hormones (it is underactive). ?When the thyroid is underactive, it produces too little of the hormones thyroxine (T4) and triiodothyronine (T3). ?The most common cause is Hashimoto's disease, a disease in which the body's disease-fighting system (immune system) attacks the thyroid gland. The condition  can also be caused by viral infections, medicine, pregnancy, or past radiation treatment to the head or neck. ?Symptoms  may include weight gain, dry skin, constipation, feeling as though you do not have energy, and not being able to tolerate cold. ?This condition is treated with medicine to replace the thyroid hormones that your body does not make. ?This information is not intended to replace advice given to you by your health care provider. Make sure you discuss any questions you have with your health care provider. ?Document Revised: 08/20/2021 Document Reviewed: 08/28/2020 ?Elsevier Patient Education ? [place]. ? ?

## 2022-03-30 ENCOUNTER — Other Ambulatory Visit: Payer: No Typology Code available for payment source

## 2022-04-01 ENCOUNTER — Other Ambulatory Visit (INDEPENDENT_AMBULATORY_CARE_PROVIDER_SITE_OTHER): Payer: No Typology Code available for payment source

## 2022-04-01 DIAGNOSIS — E039 Hypothyroidism, unspecified: Secondary | ICD-10-CM | POA: Diagnosis not present

## 2022-04-01 LAB — TSH: TSH: 35.24 u[IU]/mL — ABNORMAL HIGH (ref 0.35–5.50)

## 2022-04-01 LAB — T4, FREE: Free T4: 0.93 ng/dL (ref 0.60–1.60)

## 2022-04-01 LAB — T3, FREE: T3, Free: 3.2 pg/mL (ref 2.3–4.2)

## 2022-04-02 LAB — THYROGLOBULIN ANTIBODY: Thyroglobulin Ab: 1 IU/mL (ref <=1)

## 2022-04-02 LAB — THYROID PEROXIDASE ANTIBODY: Thyroperoxidase Ab SerPl-aCnc: >900 IU/mL — ABNORMAL HIGH (ref <9)

## 2022-04-05 ENCOUNTER — Encounter: Payer: Self-pay | Admitting: Internal Medicine

## 2022-04-07 ENCOUNTER — Other Ambulatory Visit: Payer: Self-pay | Admitting: Internal Medicine

## 2022-04-07 DIAGNOSIS — E038 Other specified hypothyroidism: Secondary | ICD-10-CM

## 2022-04-07 MED ORDER — LEVOTHYROXINE SODIUM 100 MCG PO TABS
100.0000 ug | ORAL_TABLET | Freq: Every day | ORAL | 2 refills | Status: DC
  Filled 2022-04-09: qty 90, 90d supply, fill #0

## 2022-04-09 ENCOUNTER — Other Ambulatory Visit (HOSPITAL_COMMUNITY): Payer: Self-pay

## 2022-04-13 ENCOUNTER — Other Ambulatory Visit (HOSPITAL_COMMUNITY): Payer: Self-pay

## 2022-04-14 ENCOUNTER — Encounter: Payer: Self-pay | Admitting: Internal Medicine

## 2022-04-16 ENCOUNTER — Other Ambulatory Visit: Payer: Self-pay | Admitting: Internal Medicine

## 2022-04-16 ENCOUNTER — Other Ambulatory Visit (HOSPITAL_COMMUNITY): Payer: Self-pay

## 2022-04-16 MED ORDER — LEVOTHYROXINE SODIUM 50 MCG PO TABS
100.0000 ug | ORAL_TABLET | Freq: Every day | ORAL | 4 refills | Status: DC
Start: 2022-04-16 — End: 2022-06-01
  Filled 2022-04-16 (×2): qty 120, 60d supply, fill #0

## 2022-06-01 ENCOUNTER — Ambulatory Visit: Payer: No Typology Code available for payment source | Admitting: Internal Medicine

## 2022-06-01 ENCOUNTER — Encounter: Payer: Self-pay | Admitting: Internal Medicine

## 2022-06-01 VITALS — BP 100/80 | HR 91 | Ht 66.5 in | Wt 152.6 lb

## 2022-06-01 DIAGNOSIS — E063 Autoimmune thyroiditis: Secondary | ICD-10-CM | POA: Diagnosis not present

## 2022-06-01 DIAGNOSIS — E038 Other specified hypothyroidism: Secondary | ICD-10-CM | POA: Diagnosis not present

## 2022-06-01 NOTE — Patient Instructions (Signed)
Please continue Levothyroxine 100 mcg daily.  Take the thyroid hormone every day, with water, at least 30 minutes before breakfast, separated by at least 4 hours from: - acid reflux medications - calcium - iron - multivitamins  Please stop at the lab.  Please schedule follow-up appointment in 6 months.

## 2022-06-01 NOTE — Progress Notes (Signed)
Patient ID: Melody Gibbs, female   DOB: [birth year], 36 y.o.   MRN: [id number]  HPI  Melody Gibbs is a 36 y.o.-year-old female, initially referred by her PCP, Dr. Birdie Riddle, returning for follow-up for Hashimoto's hypothyroidism.  Her step father, Casper Harrison, is also my patient.  Last visit 2 months ago.  Interim history: She describes that she feels much better after the last increase in levothyroxine 2 months ago. She feels less fatigue despite the fact that she just returned last night from a trip to [place] in [place]. She is still breast-feeding.  Her menstrual cycles have not resumed after she gave birth 10 months ago.  Reviewed and addended history: Patient describes that in 01/2022 she had an upper respiratory infection.  She was very slow to recover from this and remains very fatigued.  She went and saw her PCP on 02/24/2022 and the TSH was checked at that time.  This returns very elevated.  She had a repeat TSH along with free T4 and free T3 next day and they were all abnormal, in the hypothyroid range.    She had a recent pregnancy (no TSH levels from during the pregnancy), during which she gained a little bit more than 40 pounds.  She gave birth on 07/30/2021. Her son was larger for age (9.5 lbs) but healthy.  She was able to lose a significant amount of weight fast afterwards.  She had fluctuations of mood and energy since she gave birth.  She initially thought that this was postpartum depression.  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  At our first visit she described that she felt tired, but could keep up with work and she also continues to do intense exercise 2x a week, previously more frequently.   After starting levothyroxine, and especially after increasing the dose, she felt much better.  She started LT4 50 mcg daily in 02/2022.  We increased it to 75 mcg daily in 02/2022, then 100 mcg in 03/2022. She takes this:: - fasting - with water - separated by >30 min from  b'fast  - no calcium, iron, PPIs - + prenatal multivitamins at night  I reviewed pt's thyroid tests: Lab Results  Component Value Date   TSH 35.24 (H) 04/01/2022   TSH 131.07 (H) 02/25/2022   TSH 134.05 (H) 02/24/2022   TSH 4.380 04/01/2020   TSH 3.40 06/28/2013   TSH 5.09 12/31/2011   FREET4 0.93 04/01/2022   FREET4 0.13 (L) 02/25/2022   FREET4 0.79 06/28/2013   FREET4 0.82 12/31/2011   T3FREE 3.2 04/01/2022   T3FREE 2.0 (L) 02/25/2022   Antithyroid antibodies: Component     Latest Ref Rng 04/01/2022  Thyroglobulin Ab     < or = 1 IU/mL 1   Thyroperoxidase Ab SerPl-aCnc     <9 IU/mL >900 (H)     In 2021, she had palpitations >> a TSH was checked at that time and it was normal.   Pt denies feeling nodules in neck, dysphagia/odynophagia, SOB with lying down, resolved hoarseness.  She has + FH of thyroid disorders: biological MGM with hypothyroidsm. No FH of thyroid cancer. No FH of AI ds. No h/o radiation tx to head or neck. No recent use of iodine supplements.  ROS: Constitutional: + see HPI  Past Medical History:  Diagnosis Date   Abnormal TSH    5.9 in 2012   Past Surgical History:  Procedure Laterality Date   CESAREAN SECTION N/A 07/30/2021   Procedure:  CESAREAN SECTION;  Surgeon: Paula Compton, MD;  Location: [facility] LD [facility]S;  Service: Obstetrics;  Laterality: N/A;   DILATION AND CURETTAGE OF UTERUS     WISDOM TOOTH EXTRACTION     Social History   Socioeconomic History   Marital status: Married    Spouse name: Not on file   Number of children: 1   Years of education: Not on file   Highest education level: Not on file  Occupational History   Occupation: [employer]    Comment: Nurse   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  Tobacco Use   Smoking status: Never   Smokeless tobacco: Never  Vaping Use   Vaping Use: Never used  Substance and Sexual Activity   Alcohol use: Not Currently    Comment: occ   Drug use: No   Sexual activity: Not on file  Other Topics  Concern   Not on file  Social History Narrative   ** Merged History Encounter **       Education:  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 120 tablet 4   Prenatal Vit-Fe Fumarate-FA (PRENATAL VITAMIN PO) Take by mouth.     No current facility-administered medications on file prior to visit.   Allergies  Allergen Reactions   Penicillins     As a child   Family History  Problem Relation Age of Onset   Hypertension Father    Hypertension Maternal Grandmother    Thyroid disease Maternal Grandmother    Hypertension Other    Hyperlipidemia Other    Arthritis Other    Dementia Other    Colon cancer Neg Hx    Breast cancer Neg Hx     PE: There were no vitals taken for this visit. Wt Readings from Last 3 Encounters:  03/11/22 153 lb 3.2 oz (69.5 kg)  02/24/22 154 lb (69.9 kg)  07/29/21 191 lb 5.8 oz (86.8 kg)   Constitutional: normal weight, in NAD Eyes: PERRLA, EOMI, no exophthalmos ENT: moist mucous membranes, +Symmetrically enlarged, nontender, nonnodular, thyroid, no cervical lymphadenopathy Cardiovascular: RRR, No MRG Respiratory: CTA B Musculoskeletal: no deformities Skin: moist, warm, no rashes Neurological: no tremor with outstretched hands, DTR normal in all 4  ASSESSMENT: 1.  Hashimoto's hypothyroidism  PLAN:  1. Patient had a very high TSH level in 02/2022, at 134.  She had a repeat TSH which returned essentially the same.Dr. Birdie Riddle  recommended levothyroxine but patient was reticent to start.  She finally started at 50 mcg daily 2 weeks prior to our last appointment.  After she started the medication, she realized that she was feeling better on this initially, but the effect subsided. -At last visit, we discussed about the fact that it was possible that her TSH was high due to pre-existing autoimmune thyroiditis and that hypothyroidism may have been caused by increasing thyroid hormone demands during pregnancy.  I explained how high estrogen levels will increase the thyroid binding globulin and the  thyroid needs to produce more hormones to maintain the free thyroid levels in blood.  If patients have a slight impairment in the thyroid function, it can become manifest during pregnancy or during treatment with oral contraceptives or any other situation which may increase estrogen.   -At last visit we did check her thyroid antibodies and they returned elevated, confirming Hashimoto's thyroiditis. -At that time, we increased her LT4 dose to 75 mcg daily and then, since her TSH was still elevated (at 35.24 in 04/01/2022), I recommended to increase the dose of LT4 to 100 mcg daily and return for labs in 5 to 6 weeks.  We will check the labs today. -At this visit I advised her that whenever we change the dose of levothyroxine she has to return in 5 to 6 weeks for labs to avoid over or under treatment. -She would like to have another pregnancy but I strongly advised her to avoid this until her TFTs are perfect.  We discussed that the dose of levothyroxine will need to be increased during pregnancy. - she continues on LT4 100 mcg daily now - pt feels good on this dose. - we discussed about taking the thyroid hormone every day, with water, >30 minutes before breakfast, separated by >4 hours from acid reflux medications, calcium, iron, multivitamins. Pt. is taking it correctly. - will check thyroid tests today: TSH and fT4 - If labs are abnormal,  she will need to return for repeat TFTs in 1.5 months - OTW, I will see her back in 6 mo  Component     Latest Ref Rng 06/01/2022  TSH     0.35 - 5.50 uIU/mL 4.48   T4,Free(Direct)     0.60 - 1.60 ng/dL 1.12   TSH is much better, now in the normal range, but close to the upper limit of the target range.  We will increase the levothyroxine dose to 112 mcg daily and repeat her TFTs in 1.5 months.  Philemon Kingdom, MD PhD [facility] Endocrinology

## 2022-06-02 LAB — T4, FREE: Free T4: 1.12 ng/dL (ref 0.60–1.60)

## 2022-06-02 LAB — TSH: TSH: 4.48 u[IU]/mL (ref 0.35–5.50)

## 2022-06-03 ENCOUNTER — Other Ambulatory Visit (HOSPITAL_COMMUNITY): Payer: Self-pay

## 2022-06-03 MED ORDER — LEVOTHYROXINE SODIUM 112 MCG PO TABS
112.0000 ug | ORAL_TABLET | Freq: Every day | ORAL | 3 refills | Status: DC
  Filled 2022-06-03: qty 90, 90d supply, fill #0
  Filled 2022-08-17 – 2022-08-20 (×2): qty 90, 90d supply, fill #1
  Filled 2022-10-20: qty 90, 90d supply, fill #2

## 2022-07-19 ENCOUNTER — Other Ambulatory Visit: Payer: No Typology Code available for payment source

## 2022-07-21 ENCOUNTER — Encounter: Payer: Self-pay | Admitting: Internal Medicine

## 2022-07-23 ENCOUNTER — Other Ambulatory Visit (HOSPITAL_COMMUNITY): Payer: Self-pay

## 2022-07-23 MED ORDER — PROGESTERONE 200 MG PO CAPS
200.0000 mg | ORAL_CAPSULE | Freq: Every evening | ORAL | 1 refills | Status: DC
  Filled 2022-07-23: qty 5, 5d supply, fill #0
  Filled 2022-07-23: qty 25, 25d supply, fill #0
  Filled 2022-08-17: qty 30, 30d supply, fill #1

## 2022-07-26 ENCOUNTER — Other Ambulatory Visit: Payer: No Typology Code available for payment source

## 2022-07-27 ENCOUNTER — Other Ambulatory Visit (INDEPENDENT_AMBULATORY_CARE_PROVIDER_SITE_OTHER): Payer: No Typology Code available for payment source

## 2022-07-27 ENCOUNTER — Other Ambulatory Visit: Payer: Self-pay | Admitting: Internal Medicine

## 2022-07-27 ENCOUNTER — Other Ambulatory Visit: Payer: No Typology Code available for payment source

## 2022-07-27 DIAGNOSIS — E038 Other specified hypothyroidism: Secondary | ICD-10-CM

## 2022-07-27 DIAGNOSIS — E063 Autoimmune thyroiditis: Secondary | ICD-10-CM

## 2022-07-27 LAB — T4, FREE: Free T4: 1.27 ng/dL (ref 0.60–1.60)

## 2022-07-27 LAB — TSH: TSH: 3.83 u[IU]/mL (ref 0.35–5.50)

## 2022-08-17 ENCOUNTER — Other Ambulatory Visit (HOSPITAL_COMMUNITY): Payer: Self-pay

## 2022-08-20 ENCOUNTER — Other Ambulatory Visit (HOSPITAL_COMMUNITY): Payer: Self-pay

## 2022-08-23 ENCOUNTER — Other Ambulatory Visit: Payer: No Typology Code available for payment source

## 2022-08-24 ENCOUNTER — Other Ambulatory Visit (INDEPENDENT_AMBULATORY_CARE_PROVIDER_SITE_OTHER): Payer: No Typology Code available for payment source

## 2022-08-24 DIAGNOSIS — E063 Autoimmune thyroiditis: Secondary | ICD-10-CM | POA: Diagnosis not present

## 2022-08-24 DIAGNOSIS — E038 Other specified hypothyroidism: Secondary | ICD-10-CM | POA: Diagnosis not present

## 2022-08-24 LAB — T4, FREE: Free T4: 1.04 ng/dL (ref 0.60–1.60)

## 2022-08-24 LAB — TSH: TSH: 1.59 u[IU]/mL (ref 0.35–5.50)

## 2022-08-25 ENCOUNTER — Other Ambulatory Visit: Payer: No Typology Code available for payment source

## 2022-09-08 ENCOUNTER — Other Ambulatory Visit (HOSPITAL_COMMUNITY): Payer: Self-pay

## 2022-09-09 ENCOUNTER — Other Ambulatory Visit (HOSPITAL_COMMUNITY): Payer: Self-pay

## 2022-09-09 LAB — OB RESULTS CONSOLE ABO/RH: RH Type: POSITIVE

## 2022-09-09 LAB — HEPATITIS C ANTIBODY
HCV Ab: NEGATIVE
HCV Ab: NEGATIVE

## 2022-09-09 LAB — OB RESULTS CONSOLE GC/CHLAMYDIA
Chlamydia: NEGATIVE
Chlamydia: NEGATIVE
Neisseria Gonorrhea: NEGATIVE
Neisseria Gonorrhea: NEGATIVE

## 2022-09-09 LAB — OB RESULTS CONSOLE HEPATITIS B SURFACE ANTIGEN
Hepatitis B Surface Ag: NEGATIVE
Hepatitis B Surface Ag: NEGATIVE

## 2022-09-09 LAB — OB RESULTS CONSOLE RUBELLA ANTIBODY, IGM
Rubella: IMMUNE
Rubella: NON-IMMUNE/NOT IMMUNE

## 2022-09-09 LAB — OB RESULTS CONSOLE RPR
RPR: NONREACTIVE
RPR: NONREACTIVE

## 2022-09-09 LAB — OB RESULTS CONSOLE HIV ANTIBODY (ROUTINE TESTING)
HIV: NONREACTIVE
HIV: NONREACTIVE

## 2022-09-09 LAB — OB RESULTS CONSOLE ANTIBODY SCREEN: Antibody Screen: NEGATIVE

## 2022-09-09 MED ORDER — PROGESTERONE 200 MG PO CAPS
200.0000 mg | ORAL_CAPSULE | Freq: Every evening | ORAL | 1 refills | Status: DC
  Filled 2022-09-09 – 2022-09-10 (×2): qty 30, 30d supply, fill #0

## 2022-09-10 ENCOUNTER — Other Ambulatory Visit (HOSPITAL_COMMUNITY): Payer: Self-pay

## 2022-09-14 ENCOUNTER — Other Ambulatory Visit: Payer: Self-pay

## 2022-09-16 LAB — HM PAP SMEAR

## 2022-09-17 ENCOUNTER — Other Ambulatory Visit: Payer: Self-pay

## 2022-10-04 ENCOUNTER — Other Ambulatory Visit (INDEPENDENT_AMBULATORY_CARE_PROVIDER_SITE_OTHER): Payer: No Typology Code available for payment source

## 2022-10-04 DIAGNOSIS — E038 Other specified hypothyroidism: Secondary | ICD-10-CM

## 2022-10-04 DIAGNOSIS — E063 Autoimmune thyroiditis: Secondary | ICD-10-CM

## 2022-10-04 LAB — TSH: TSH: 2.45 u[IU]/mL (ref 0.35–5.50)

## 2022-10-04 LAB — T4, FREE: Free T4: 0.93 ng/dL (ref 0.60–1.60)

## 2022-10-20 ENCOUNTER — Other Ambulatory Visit (HOSPITAL_COMMUNITY): Payer: Self-pay

## 2022-10-22 ENCOUNTER — Other Ambulatory Visit: Payer: Self-pay | Admitting: Internal Medicine

## 2022-10-22 ENCOUNTER — Encounter: Payer: Self-pay | Admitting: Internal Medicine

## 2022-10-22 ENCOUNTER — Other Ambulatory Visit (HOSPITAL_COMMUNITY): Payer: Self-pay

## 2022-10-22 DIAGNOSIS — E038 Other specified hypothyroidism: Secondary | ICD-10-CM

## 2022-10-25 ENCOUNTER — Other Ambulatory Visit: Payer: Self-pay

## 2022-10-25 ENCOUNTER — Other Ambulatory Visit (HOSPITAL_COMMUNITY): Payer: Self-pay

## 2022-10-25 DIAGNOSIS — E038 Other specified hypothyroidism: Secondary | ICD-10-CM

## 2022-10-25 MED ORDER — LEVOTHYROXINE SODIUM 112 MCG PO TABS
ORAL_TABLET | ORAL | 3 refills | Status: DC
  Filled 2022-10-25: qty 90, fill #0
  Filled 2022-10-25: qty 90, 70d supply, fill #0
  Filled 2022-12-30: qty 90, 70d supply, fill #1
  Filled 2023-03-10: qty 90, 70d supply, fill #2
  Filled 2023-05-19: qty 90, 70d supply, fill #3

## 2022-10-25 MED ORDER — LEVOTHYROXINE SODIUM 112 MCG PO TABS: ORAL_TABLET | ORAL | 3 refills | Status: DC

## 2022-11-15 ENCOUNTER — Other Ambulatory Visit: Payer: Self-pay | Admitting: Obstetrics and Gynecology

## 2022-11-15 DIAGNOSIS — O43109 Malformation of placenta, unspecified, unspecified trimester: Secondary | ICD-10-CM

## 2022-11-15 DIAGNOSIS — Z363 Encounter for antenatal screening for malformations: Secondary | ICD-10-CM

## 2022-11-15 DIAGNOSIS — O09522 Supervision of elderly multigravida, second trimester: Secondary | ICD-10-CM

## 2022-11-29 ENCOUNTER — Encounter: Payer: Self-pay | Admitting: Internal Medicine

## 2022-11-29 ENCOUNTER — Ambulatory Visit (INDEPENDENT_AMBULATORY_CARE_PROVIDER_SITE_OTHER): Payer: No Typology Code available for payment source | Admitting: Internal Medicine

## 2022-11-29 VITALS — BP 110/58 | HR 81 | Ht 66.5 in | Wt 166.0 lb

## 2022-11-29 DIAGNOSIS — E063 Autoimmune thyroiditis: Secondary | ICD-10-CM | POA: Diagnosis not present

## 2022-11-29 DIAGNOSIS — E038 Other specified hypothyroidism: Secondary | ICD-10-CM

## 2022-11-29 LAB — T4, FREE: Free T4: 0.86 ng/dL (ref 0.60–1.60)

## 2022-11-29 LAB — TSH: TSH: 2.07 u[IU]/mL (ref 0.35–5.50)

## 2022-11-29 NOTE — Progress Notes (Unsigned)
Patient ID: Melody Gibbs, female   DOB: 06/14/1986, 36 y.o.   MRN: [id number]  HPI  Melody Gibbs is a 36 y.o.-year-old female, initially referred by her PCP, Dr. Birdie Riddle, returning for follow-up for Hashimoto's hypothyroidism.  Her step father, Casper Harrison, is also my patient.  Last visit 6 months ago.  Interim history: She feels well, without complaints today. At last visit she was still breast-feeding.  She still did not have a menstrual cycle -was 10 months postpartum. She is now [redacted] weeks pregnant. She is due 03/28/2023. She just returned from [place] - visiting for ~10 days. Son had otitis - just had ear tubes placed. She also moved since last visit.  Reviewed and addended history: Patient describes that in 01/2022 she had an upper respiratory infection.  She was very slow to recover from this and remains very fatigued.  She went and saw her PCP on 02/24/2022 and the TSH was checked at that time.  This returns very elevated.  She had a repeat TSH along with free T4 and free T3 next day and they were all abnormal, in the hypothyroid range.    She had a recent pregnancy (no TSH levels from during the pregnancy), during which she gained a little bit more than 40 pounds.  She gave birth on 07/30/2021. Her son was larger for age (9.5 lbs) but healthy.  She was able to lose a significant amount of weight fast afterwards.  She had fluctuations of mood and energy since she gave birth.  She initially thought that this was postpartum depression.  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  At our first visit she described that she felt tired, but could keep up with work and she also continues to do intense exercise 2x a week, previously more frequently.   After starting levothyroxine, and especially after increasing the dose, she felt much better.  She started LT4 50 mcg daily in 02/2022.  We then increase the dose gradually, with the latest increase being to 112 mcg daily in 05/2022.  She takes  this: - missed one dose - fasting - with water - separated by >30 min from b'fast  - no calcium, iron, PPIs - + prenatal multivitamins at night  I reviewed pt's thyroid tests: Lab Results  Component Value Date   TSH 2.45 10/04/2022   TSH 1.59 08/24/2022   TSH 3.83 07/27/2022   TSH 4.48 06/01/2022   TSH 35.24 (H) 04/01/2022   TSH 131.07 (H) 02/25/2022   TSH 134.05 (H) 02/24/2022   TSH 4.380 04/01/2020   TSH 3.40 06/28/2013   TSH 5.09 12/31/2011   FREET4 0.93 10/04/2022   FREET4 1.04 08/24/2022   FREET4 1.27 07/27/2022   FREET4 1.12 06/01/2022   FREET4 0.93 04/01/2022   FREET4 0.13 (L) 02/25/2022   FREET4 0.79 06/28/2013   FREET4 0.82 12/31/2011   T3FREE 3.2 04/01/2022   T3FREE 2.0 (L) 02/25/2022   Antithyroid antibodies: Component     Latest Ref Rng 04/01/2022  Thyroglobulin Ab     < or = 1 IU/mL 1   Thyroperoxidase Ab SerPl-aCnc     <9 IU/mL >900 (H)     In 2021, she had palpitations >> a TSH was checked at that time and it was normal.   Pt denies feeling nodules in neck, dysphagia/odynophagia, SOB with lying down, resolved hoarseness.  She has + FH of thyroid disorders: biological MGM with hypothyroidsm. No FH of thyroid cancer. No FH of AI ds.  No h/o radiation tx to head or neck. No recent use of iodine supplements.  ROS: Constitutional: + see HPI  Past Medical History:  Diagnosis Date   Abnormal TSH    5.9 in 2012   Past Surgical History:  Procedure Laterality Date   CESAREAN SECTION N/A 07/30/2021   Procedure: CESAREAN SECTION;  Surgeon: Paula Compton, MD;  Location: [place] LD ORS;  Service: Obstetrics;  Laterality: N/A;   DILATION AND CURETTAGE OF UTERUS     WISDOM TOOTH EXTRACTION     Social History   Socioeconomic History   Marital status: Married    Spouse name: Not on file   Number of children: 1   Years of education: Not on file   Highest education level: Not on file  Occupational History   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  Tobacco Use   Smoking status: Never   Smokeless tobacco: Never  Vaping Use   Vaping Use: Never used  Substance and Sexual Activity   Alcohol use: Not Currently    Comment: occ   Drug use: No   Sexual activity: Not on file  Other Topics Concern   Not on file  Social History Narrative   ** Merged History Encounter **       Education:  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 Increase to 2 tablets by mouth 2 days out of the week. 90 tablet 3   Prenatal Vit-Fe Fumarate-FA (PRENATAL VITAMIN PO) Take by mouth.     progesterone (PROMETRIUM) 200 MG capsule Place 1 capsule (200 mg total) vaginally nightly through the 1st trimester 30 capsule 1   No current facility-administered medications on file prior to visit.   Allergies  Allergen Reactions   Penicillins     As a child   Family History  Problem Relation Age of Onset   Hypertension Father    Hypertension Maternal Grandmother    Thyroid disease Maternal Grandmother    Hypertension Other    Hyperlipidemia Other    Arthritis Other    Dementia Other    Colon cancer Neg Hx    Breast cancer Neg Hx     PE: There were no vitals taken for this visit. Wt Readings from Last 3 Encounters:  06/01/22 152 lb 9.6 oz (69.2 kg)  03/11/22 153 lb 3.2 oz (69.5 kg)  02/24/22 154 lb (69.9 kg)   Constitutional: normal weight, in NAD Eyes: EOMI, no exophthalmos ENT: + Symmetrically  enlarged, nontender, nonnodular, thyroid, no cervical lymphadenopathy Cardiovascular: RRR, No MRG Respiratory: CTA B Musculoskeletal: no deformities Skin: no rashes Neurological: no tremor with outstretched hands  ASSESSMENT: 1.  Hashimoto's hypothyroidism -Now pregnant in the second trimester  PLAN:  1. Patient with a history of a very high TSH, 134, in 02/2022.  She had a repeat TSH which returned essentially the same. Dr. Birdie Riddle recommended levothyroxine but patient was  reticent to start.  She finally started at 50 mcg daily and we titrated the dose afterwards.  -We subsequently checked her thyroid antibodies and they were elevated, confirming Hashimoto's thyroiditis - latest thyroid labs reviewed with pt. >> normal: Lab Results  Component Value Date   TSH 2.45 10/04/2022  - she continues on LT4 112 mcg - 9 tabs a week during pregnancy - pt feels good on this dose.  No nausea, leg swelling.  She has some headaches.  She continues to exercise. - at last visit, she mentions that she wanted another pregnancy.  We discussed about the absolute need for normal TFTs before and during the pregnancy.  Also, we discussed that the dose of levothyroxine needs to be increased at the beginning of the pregnancy and that she should let me know if she were to become pregnant so we can increase the dose. -We discussed about targets for TSH during pregnancy: <2.5 in the first trimester and <3 afterwards.  She had labs a month ago and the TSH was at goal.   - I advised her to reduce the dose back to 112 mcg daily after she gives birth - we discussed about taking the thyroid hormone every day, with water, >30 minutes before breakfast, separated by >4 hours from acid reflux medications, calcium, iron, multivitamins. Pt. is taking it correctly. - will check thyroid tests today: TSH and fT4 and will also add thyroid antibodies.  We discussed that if these are elevated, the baby needs to be monitored for  development of goiter. - If labs are abnormal, she will need to return for repeat TFTs in 1.5 months - OTW, I will see her back in 5 months, ~1 month after she gives birth - Afterwards, I plan to see her on a yearly basis  - Total time spent for the visit: 25 min, in precharting, obtaining medical information from the chart, reviewing her  previous labs, evaluations, and treatments, reviewing her symptoms, counseling her about her condition especially during pregnancy (please see the discussed topics above), and developing a plan to further investigate and treat it.  Philemon Kingdom, MD PhD [facility] Endocrinology

## 2022-11-29 NOTE — Patient Instructions (Addendum)
Please continue Levothyroxine 112 mcg daily except for 2x 112 mcg 2x a week.  Take the thyroid hormone every day, with water, at least 30 minutes before breakfast, separated by at least 4 hours from: - acid reflux medications - calcium - iron - multivitamins  Please stop at the lab.  Please schedule follow-up appointment in 5 months.

## 2022-11-30 LAB — THYROGLOBULIN ANTIBODY: Thyroglobulin Ab: <1 IU/mL (ref <=1)

## 2022-11-30 LAB — THYROID PEROXIDASE ANTIBODY: Thyroperoxidase Ab SerPl-aCnc: 141 IU/mL — ABNORMAL HIGH (ref <9)

## 2022-12-01 ENCOUNTER — Encounter: Payer: Self-pay | Admitting: Internal Medicine

## 2022-12-10 ENCOUNTER — Ambulatory Visit (INDEPENDENT_AMBULATORY_CARE_PROVIDER_SITE_OTHER): Payer: No Typology Code available for payment source | Admitting: Family Medicine

## 2022-12-10 ENCOUNTER — Encounter: Payer: Self-pay | Admitting: Family Medicine

## 2022-12-10 VITALS — BP 118/76 | HR 71 | Temp 98.7°F | Resp 17 | Ht 66.5 in | Wt 168.5 lb

## 2022-12-10 DIAGNOSIS — J029 Acute pharyngitis, unspecified: Secondary | ICD-10-CM

## 2022-12-10 DIAGNOSIS — R052 Subacute cough: Secondary | ICD-10-CM

## 2022-12-10 LAB — POCT RAPID STREP A (OFFICE): Rapid Strep A Screen: NEGATIVE

## 2022-12-10 NOTE — Patient Instructions (Signed)
Follow up as needed or as scheduled Start a daily Claritin or Zyrtec to help w/ congestion and drainage ADD Flonase- 2 sprays each nostril until feeling better Tylenol as needed for sore throat Drink LOTS of fluids Cough medicine as needed Call with any questions or concerns Hang in there! Happy Holidays!!! CONGRATS!!!

## 2022-12-10 NOTE — Progress Notes (Signed)
   Subjective:    Patient ID: Melody Gibbs, female    DOB: [birth year], 36 y.o.   MRN: [id number]  HPI Sore throat- has a 48 month old in daycare who has been 'constantly sick'.  Developed a cough in early November.  'i'm feeling fine'.  A few days ago developed sore throat.  Cough is productive of thick white mucous.  Copious PND at night.     Review of Systems For ROS see HPI     Objective:   Physical Exam Vitals reviewed.  Constitutional:      General: She is not in acute distress.    Appearance: Normal appearance. She is well-developed. She is not ill-appearing.  HENT:     Head: Normocephalic and atraumatic.     Right Ear: Tympanic membrane normal.     Left Ear: Tympanic membrane normal.     Nose: Mucosal edema and congestion present. No rhinorrhea.     Right Sinus: No maxillary sinus tenderness or frontal sinus tenderness.     Left Sinus: No maxillary sinus tenderness or frontal sinus tenderness.     Mouth/Throat:     Pharynx: Posterior oropharyngeal erythema (w/ PND) present.  Eyes:     Conjunctiva/sclera: Conjunctivae normal.     Pupils: Pupils are equal, round, and reactive to light.  Cardiovascular:     Rate and Rhythm: Normal rate and regular rhythm.     Heart sounds: Normal heart sounds.  Pulmonary:     Effort: Pulmonary effort is normal. No respiratory distress.     Breath sounds: Normal breath sounds. No wheezing or rales.  Musculoskeletal:     Cervical back: Normal range of motion and neck supple.  Lymphadenopathy:     Cervical: No cervical adenopathy.  Skin:    General: Skin is warm and dry.  Neurological:     General: No focal deficit present.     Mental Status: She is alert and oriented to person, place, and time.  Psychiatric:        Mood and Affect: Mood normal.        Behavior: Behavior normal.        Thought Content: Thought content normal.           Assessment & Plan:  Subacute cough/sore throat- new.  Given pt's PE her sxs are most likely  due to copious PND.  Since she is pregnant, will avoid steroids.  Start daily antihistamine and nasal steroid.  Reviewed supportive care and red flags that should prompt return.  Pt expressed understanding and is in agreement w/ plan.

## 2022-12-13 ENCOUNTER — Encounter: Payer: Self-pay | Admitting: *Deleted

## 2022-12-16 ENCOUNTER — Ambulatory Visit: Payer: No Typology Code available for payment source | Admitting: *Deleted

## 2022-12-16 ENCOUNTER — Ambulatory Visit (HOSPITAL_BASED_OUTPATIENT_CLINIC_OR_DEPARTMENT_OTHER): Payer: No Typology Code available for payment source | Admitting: Maternal & Fetal Medicine

## 2022-12-16 ENCOUNTER — Ambulatory Visit: Payer: No Typology Code available for payment source | Attending: Obstetrics and Gynecology

## 2022-12-16 VITALS — BP 124/70 | HR 71

## 2022-12-16 DIAGNOSIS — O43102 Malformation of placenta, unspecified, second trimester: Secondary | ICD-10-CM | POA: Diagnosis not present

## 2022-12-16 DIAGNOSIS — E039 Hypothyroidism, unspecified: Secondary | ICD-10-CM

## 2022-12-16 DIAGNOSIS — O402XX0 Polyhydramnios, second trimester, not applicable or unspecified: Secondary | ICD-10-CM

## 2022-12-16 DIAGNOSIS — O401XX0 Polyhydramnios, first trimester, not applicable or unspecified: Secondary | ICD-10-CM | POA: Insufficient documentation

## 2022-12-16 DIAGNOSIS — Z3A25 25 weeks gestation of pregnancy: Secondary | ICD-10-CM | POA: Insufficient documentation

## 2022-12-16 DIAGNOSIS — O43109 Malformation of placenta, unspecified, unspecified trimester: Secondary | ICD-10-CM | POA: Insufficient documentation

## 2022-12-16 DIAGNOSIS — O09522 Supervision of elderly multigravida, second trimester: Secondary | ICD-10-CM | POA: Diagnosis not present

## 2022-12-16 DIAGNOSIS — O99282 Endocrine, nutritional and metabolic diseases complicating pregnancy, second trimester: Secondary | ICD-10-CM | POA: Diagnosis not present

## 2022-12-16 DIAGNOSIS — O34592 Maternal care for other abnormalities of gravid uterus, second trimester: Secondary | ICD-10-CM

## 2022-12-16 DIAGNOSIS — Z363 Encounter for antenatal screening for malformations: Secondary | ICD-10-CM | POA: Diagnosis present

## 2022-12-16 NOTE — Progress Notes (Signed)
MFM Consult Note Patient Name: Melody Gibbs  Patient MRN:   [id number]  Referring provider: Marvel Plan  Reason for Consult: Placental abnormality, polyhydramnios, large placental lake, hypothyroidism, previous C-section, advanced maternal age  HPI: Melody Gibbs is a 36 y.o. G3P1011 at 23w3dhere for ultrasound and consultation.   I discussed the ultrasound findings of polyhydramnios, a large subtle lucency representative of placental lake in addition to what appears to be a partial septum in the uterus.  The patient also has a history of Hashimoto's thyroiditis and is compliant with Synthroid.  She is advanced maternal age and had low risk and IPS.  She has a history of 1 miscarriage and a D&C.  I discussed the clinical significance and implications of these medical conditions in addition to the antenatal course of continued ultrasounds to assess for growth as well as potential evolution of this subtle lucency.  Given the size I recommend she follow-up in 2 to 3 weeks to assess for potential cardiac compromise due to the size of the placental subtle lucency.  I discussed the diagnosis of polyhydramnios and the clinical implications during pregnancy. There cardiac anatomy appears normal. There is no evidence of chorioangioma. The fetal movement is normal without evidence of arthrogryposis. The fetal stomach and bladder appears normal. The fetal palate (although limited in views) and neck appears to be normal without evidence of mass. There is no evidence of of lower spine or pelvic abnormalities.   Polyhydramnios was seen on today's ultrasound. I discussed this could be a false positive due to fetal position localizing most of the fluid to that single pocket, but that would continue to assess the amniotic fluid volume during her subsequent ultrasounds.  I discussed the diagnosis of polyhydramnios and the clinical implications during pregnancy. Approximately 1% of newborns and 5 to 10% of fetuses with mild  polyhydramnios will have an abnormality (tracheoesophageal fistula are among the most common abnormalities associated with polyhydramnios).  This increases to 2-5 times a risk with moderate and severe polyhydramnios.  Currently there is no treatment of polyhydramnios if it is idiopathic or due to fetal abnormalities.  If the elevation in fluid is related to maternal diabetes, better glucose control should reduce the degree of polyhydramnios from osmotic diuresis.  I discussed that based on today's ultrasound it appears she has a partial uterine septum versus a synechia.  It is most likely a septum given that she has been told this in the past even though she had a D&C which can lead to uterine synechiae.  I discussed the potential association with growth abnormalities in addition to fetal malpresentation and the need for future ultrasounds.  She has advanced maternal age but had normal cell free DNA.  She declined amniocentesis.  She has hypothyroidism that started 7 months after her prior delivery.  She is compliant with Synthroid and has had normal lab studies per her report.  She is a prior C-section but desires TOLAC.  Review of Systems: A review of systems was performed and was negative except per HPI   Vitals and Physical Exam See intake sheet for vitals Sitting comfortably on the sonogram table Nonlabored breathing Normal rate and rhythm Abdomen is nontender  Sonographic findings Single intrauterine pregnancy. Observed fetal cardiac activity. Variable presentation. Fetal anatomy that was well seen appears normal without evidence of soft markers. Not all fetal structures were well seen due to a technically diffcult exam and the anatomic survey remains incomplete with limited views of the left outflow tract, aortic  arch, vena cava, heels and feet.  The patient did not want to know the gender. Fetal biometry shows the estimated fetal weight at the 78 percentile.  Amniotic fluid volume:  Polyhydramnios. Placenta: Posterior.  There is a large anechoic structure within the placenta consistent with a large central lucency or placental lake measuring about 5 x 6 cm.  There is no evidence of fetal vessels or blood flow to the area that appears abnormal.  It does not appear to be hemorrhage or coagulation. Cervix: Normal appearance by transabdominal scan. with a cervical length of 4.8 cm. Adnexa: No adnexal mass visualized.  Uterus: There appears to be a partial uterine septum with a fetus on the right of the septum.  It does not appear that the placenta touches the septum at all.  Genetic testing: Risk cell free DNA.  Assessment -Placental lake, large -Polyhydramnios -etiology unknown, could be due to gestational diabetes or the placental lake abnormality -Partial uterine septum Plan -Growth [place] in 3 weeks.  I offered the patient to come back in 2 weeks for a fetal hydrops check due to the size of the sonolucent seen but she declined in favor of return in 3 weeks for growth ultrasound. -Glucola should be done in the next 1-2 weeks due to poly -Serial growth ultrasounds to assess fetal growth and look for potential causes of polyhydramnios and fetus -Delivery timing does not need to be adjusted and delivery at 39 weeks is reasonable -Amniotic fluid reduction is usually reserved for severe probably with extreme maternal discomfort -Continue Synthroid and assessment of her thyroid function via laboratory studies every trimester if medication adjustments are not required or every 4 to 6 weeks if adjustment is required.  I spent 60 minutes reviewing the patients chart, including labs and images as well as counseling the patient about her medical conditions.  Valeda Malm  MFM, [place]   12/16/2022  5:31 PM

## 2022-12-17 ENCOUNTER — Other Ambulatory Visit: Payer: Self-pay | Admitting: *Deleted

## 2022-12-17 DIAGNOSIS — O99282 Endocrine, nutritional and metabolic diseases complicating pregnancy, second trimester: Secondary | ICD-10-CM

## 2022-12-17 DIAGNOSIS — O09522 Supervision of elderly multigravida, second trimester: Secondary | ICD-10-CM

## 2022-12-17 DIAGNOSIS — Z362 Encounter for other antenatal screening follow-up: Secondary | ICD-10-CM

## 2022-12-17 DIAGNOSIS — O4392 Unspecified placental disorder, second trimester: Secondary | ICD-10-CM

## 2022-12-29 ENCOUNTER — Encounter: Payer: Self-pay | Admitting: Maternal & Fetal Medicine

## 2023-01-03 ENCOUNTER — Other Ambulatory Visit (HOSPITAL_COMMUNITY): Payer: Self-pay

## 2023-01-03 DIAGNOSIS — Z3A28 28 weeks gestation of pregnancy: Secondary | ICD-10-CM | POA: Diagnosis not present

## 2023-01-03 DIAGNOSIS — Z23 Encounter for immunization: Secondary | ICD-10-CM | POA: Diagnosis not present

## 2023-01-03 DIAGNOSIS — Z3689 Encounter for other specified antenatal screening: Secondary | ICD-10-CM | POA: Diagnosis not present

## 2023-01-06 ENCOUNTER — Other Ambulatory Visit: Payer: Self-pay | Admitting: *Deleted

## 2023-01-06 ENCOUNTER — Ambulatory Visit (HOSPITAL_BASED_OUTPATIENT_CLINIC_OR_DEPARTMENT_OTHER): Payer: 59

## 2023-01-06 ENCOUNTER — Ambulatory Visit: Payer: 59 | Attending: Maternal & Fetal Medicine | Admitting: *Deleted

## 2023-01-06 VITALS — BP 125/67 | HR 67

## 2023-01-06 DIAGNOSIS — O43893 Other placental disorders, third trimester: Secondary | ICD-10-CM | POA: Diagnosis not present

## 2023-01-06 DIAGNOSIS — O09523 Supervision of elderly multigravida, third trimester: Secondary | ICD-10-CM

## 2023-01-06 DIAGNOSIS — O4392 Unspecified placental disorder, second trimester: Secondary | ICD-10-CM

## 2023-01-06 DIAGNOSIS — O3663X0 Maternal care for excessive fetal growth, third trimester, not applicable or unspecified: Secondary | ICD-10-CM

## 2023-01-06 DIAGNOSIS — Z3A28 28 weeks gestation of pregnancy: Secondary | ICD-10-CM

## 2023-01-06 DIAGNOSIS — O403XX0 Polyhydramnios, third trimester, not applicable or unspecified: Secondary | ICD-10-CM | POA: Diagnosis not present

## 2023-01-06 DIAGNOSIS — O99283 Endocrine, nutritional and metabolic diseases complicating pregnancy, third trimester: Secondary | ICD-10-CM | POA: Diagnosis not present

## 2023-01-06 DIAGNOSIS — O34593 Maternal care for other abnormalities of gravid uterus, third trimester: Secondary | ICD-10-CM

## 2023-01-06 DIAGNOSIS — E063 Autoimmune thyroiditis: Secondary | ICD-10-CM | POA: Diagnosis not present

## 2023-01-06 DIAGNOSIS — O09522 Supervision of elderly multigravida, second trimester: Secondary | ICD-10-CM

## 2023-01-06 DIAGNOSIS — Z362 Encounter for other antenatal screening follow-up: Secondary | ICD-10-CM

## 2023-01-06 DIAGNOSIS — Q5122 Other partial doubling of uterus: Secondary | ICD-10-CM | POA: Diagnosis not present

## 2023-01-06 DIAGNOSIS — E039 Hypothyroidism, unspecified: Secondary | ICD-10-CM

## 2023-01-07 ENCOUNTER — Ambulatory Visit: Payer: 59

## 2023-01-14 ENCOUNTER — Ambulatory Visit: Payer: 59

## 2023-02-09 ENCOUNTER — Ambulatory Visit: Payer: 59 | Attending: Maternal & Fetal Medicine | Admitting: *Deleted

## 2023-02-09 ENCOUNTER — Ambulatory Visit: Payer: 59

## 2023-02-09 ENCOUNTER — Encounter: Payer: Self-pay | Admitting: *Deleted

## 2023-02-09 ENCOUNTER — Ambulatory Visit (HOSPITAL_BASED_OUTPATIENT_CLINIC_OR_DEPARTMENT_OTHER): Payer: 59

## 2023-02-09 ENCOUNTER — Other Ambulatory Visit: Payer: Self-pay | Admitting: *Deleted

## 2023-02-09 DIAGNOSIS — O3403 Maternal care for unspecified congenital malformation of uterus, third trimester: Secondary | ICD-10-CM | POA: Diagnosis not present

## 2023-02-09 DIAGNOSIS — Z3A33 33 weeks gestation of pregnancy: Secondary | ICD-10-CM

## 2023-02-09 DIAGNOSIS — O43893 Other placental disorders, third trimester: Secondary | ICD-10-CM | POA: Diagnosis not present

## 2023-02-09 DIAGNOSIS — E039 Hypothyroidism, unspecified: Secondary | ICD-10-CM

## 2023-02-09 DIAGNOSIS — O403XX0 Polyhydramnios, third trimester, not applicable or unspecified: Secondary | ICD-10-CM

## 2023-02-09 DIAGNOSIS — O3663X0 Maternal care for excessive fetal growth, third trimester, not applicable or unspecified: Secondary | ICD-10-CM | POA: Diagnosis not present

## 2023-02-09 DIAGNOSIS — Q5122 Other partial doubling of uterus: Secondary | ICD-10-CM

## 2023-02-09 DIAGNOSIS — O99283 Endocrine, nutritional and metabolic diseases complicating pregnancy, third trimester: Secondary | ICD-10-CM | POA: Diagnosis not present

## 2023-02-09 DIAGNOSIS — O321XX0 Maternal care for breech presentation, not applicable or unspecified: Secondary | ICD-10-CM | POA: Insufficient documentation

## 2023-02-09 DIAGNOSIS — E063 Autoimmune thyroiditis: Secondary | ICD-10-CM | POA: Insufficient documentation

## 2023-02-09 DIAGNOSIS — O09523 Supervision of elderly multigravida, third trimester: Secondary | ICD-10-CM

## 2023-02-09 DIAGNOSIS — H5213 Myopia, bilateral: Secondary | ICD-10-CM | POA: Diagnosis not present

## 2023-02-22 ENCOUNTER — Encounter: Payer: Self-pay | Admitting: Family Medicine

## 2023-03-01 DIAGNOSIS — Z3685 Encounter for antenatal screening for Streptococcus B: Secondary | ICD-10-CM | POA: Diagnosis not present

## 2023-03-01 DIAGNOSIS — Z3689 Encounter for other specified antenatal screening: Secondary | ICD-10-CM | POA: Diagnosis not present

## 2023-03-01 LAB — OB RESULTS CONSOLE GBS
GBS: POSITIVE
GBS: POSITIVE

## 2023-03-09 ENCOUNTER — Ambulatory Visit: Payer: 59 | Admitting: *Deleted

## 2023-03-09 ENCOUNTER — Other Ambulatory Visit: Payer: Self-pay | Admitting: Obstetrics

## 2023-03-09 ENCOUNTER — Ambulatory Visit: Payer: 59 | Attending: Obstetrics

## 2023-03-09 VITALS — BP 118/66 | HR 92

## 2023-03-09 DIAGNOSIS — Q5122 Other partial doubling of uterus: Secondary | ICD-10-CM

## 2023-03-09 DIAGNOSIS — O43193 Other malformation of placenta, third trimester: Secondary | ICD-10-CM

## 2023-03-09 DIAGNOSIS — O99283 Endocrine, nutritional and metabolic diseases complicating pregnancy, third trimester: Secondary | ICD-10-CM | POA: Diagnosis not present

## 2023-03-09 DIAGNOSIS — E063 Autoimmune thyroiditis: Secondary | ICD-10-CM

## 2023-03-09 DIAGNOSIS — O403XX0 Polyhydramnios, third trimester, not applicable or unspecified: Secondary | ICD-10-CM | POA: Diagnosis not present

## 2023-03-09 DIAGNOSIS — Z3A37 37 weeks gestation of pregnancy: Secondary | ICD-10-CM

## 2023-03-09 DIAGNOSIS — E039 Hypothyroidism, unspecified: Secondary | ICD-10-CM

## 2023-03-09 DIAGNOSIS — O09523 Supervision of elderly multigravida, third trimester: Secondary | ICD-10-CM

## 2023-03-09 DIAGNOSIS — O34593 Maternal care for other abnormalities of gravid uterus, third trimester: Secondary | ICD-10-CM

## 2023-03-16 ENCOUNTER — Encounter (HOSPITAL_COMMUNITY): Payer: Self-pay

## 2023-03-16 ENCOUNTER — Telehealth (HOSPITAL_COMMUNITY): Payer: Self-pay | Admitting: *Deleted

## 2023-03-16 NOTE — Telephone Encounter (Signed)
Preadmission screen  

## 2023-03-16 NOTE — Patient Instructions (Signed)
Melody Gibbs  03/16/2023   Your procedure is scheduled on:  03/28/2022  Arrive at [facility] at Entrance C on [address] at [facility]  and [address]. You are invited to use the FREE valet parking or use the Visitor's parking deck.  Pick up the phone at the desk and dial [phone].  Call this number if you have problems the morning of surgery: [phone]  Remember:   Do not eat food:(After Midnight) Desps de medianoche.  Do not drink clear liquids: (After Midnight) Desps de medianoche.  Take these medicines the morning of surgery with A SIP OF WATER:  levothyroxine   Do not wear jewelry, make-up or nail polish.  Do not wear lotions, powders, or perfumes. Do not wear deodorant.  Do not shave 48 hours prior to surgery.  Do not bring valuables to the hospital.  [facility] is not   responsible for any belongings or valuables brought to the hospital.  Contacts, dentures or bridgework may not be worn into surgery.  Leave suitcase in the car. After surgery it may be brought to your room.  For patients admitted to the hospital, checkout time is 11:00 AM the day of              discharge.      Please read over the following fact sheets that you were given:     Preparing for Surgery

## 2023-03-18 ENCOUNTER — Encounter (HOSPITAL_COMMUNITY): Payer: Self-pay

## 2023-03-26 NOTE — H&P (Signed)
Melody Gibbs is a 37 y.o. female G3P1011 at 45 1/7 weeks (EDD 03/28/23 by 8 week [place] and unknown LMP) presenting for repeat scheduled c-section.  Prenatal care significant for: 1) Hypothyroidism  Dr. 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   5) Advanced maternal age gravida  NIPT low risk   6) History of cesarean section  LTCS for arrest of dilation Baby 9#3oz, repeat LTCS 4-2   7) Placental abnormality  MFM consult large placental lake originally 6cm, improved on f/u scan  8) Family HX of genetic sx  FOB with FH Barb Merino syndrome-he is a carrier, autosomal recessive, Autumm got comprehensive genetic screening-her test is negative  OB History     Gravida  3   Para  1   Term  1   Preterm      AB  1   Living  1      SAB  1   IAB      Ectopic      Multiple  0   Live Births  1         09-06-2019, 8 wks SAB  07-30-2021, 41.2 wks M, 9lbs 3oz, Cesarean Section  Past Medical History:  Diagnosis Date   Abnormal TSH    5.9 in 2012   Hypothyroidism    Past Surgical History:  Procedure Laterality Date   CESAREAN SECTION N/A 07/30/2021   Procedure: CESAREAN SECTION;  Surgeon: Paula Compton, MD;  Location: [facility] LD ORS;  Service: Obstetrics;  Laterality: N/A;   DILATION AND CURETTAGE OF UTERUS     WISDOM TOOTH EXTRACTION     Family History: family history includes Arthritis in an other family member; Cancer in her maternal grandmother; Dementia in an other family member; Hyperlipidemia in an other family member; Hypertension in her father, maternal grandmother, and another family member; Thyroid disease in her maternal grandmother. Social History:  reports that she has never smoked. She has never used smokeless tobacco. She reports that she does not currently use  alcohol. She reports that she does not use drugs.     Maternal Diabetes: No Genetic Screening: Normal Maternal Ultrasounds/Referrals: Normal Fetal Ultrasounds or other Referrals:  Referred to Materal Fetal Medicine  Maternal Substance Abuse:  No Significant Maternal Medications:  Meds include: Syntroid Significant Maternal Lab Results:  Group B Strep positive Number of Prenatal Visits:greater than 3 verified prenatal visits Other Comments:  None  Review of Systems  Constitutional:  Negative for fever.  Gastrointestinal:  Negative for abdominal pain.  Genitourinary:  Negative for vaginal bleeding.   Maternal Medical History:  Contractions: Frequency: irregular.   Perceived severity is mild.   Fetal activity: Perceived fetal activity is normal.   Prenatal Complications - Diabetes: none.     unknown if currently breastfeeding. Maternal Exam:  Uterine Assessment: Contraction strength is mild.  Contraction frequency is irregular.  Abdomen: Patient reports no abdominal tenderness. Surgical scars: low transverse.   Introitus: Normal vulva. Normal vagina.    Physical Exam Constitutional:      Appearance: Normal appearance.  Cardiovascular:     Rate and Rhythm: Normal rate and regular rhythm.  Pulmonary:     Effort: Pulmonary effort is normal.  Abdominal:     Palpations: Abdomen is soft.  Genitourinary:    General:  Normal vulva.  Neurological:     General: No focal deficit present.     Mental Status: She is alert.  Psychiatric:        Mood and Affect: Mood normal.        Behavior: Behavior normal.     Prenatal labs: ABO, Rh: O/Positive/-- (09/14 0000) Antibody: Negative (09/14 0000) Rubella: Immune (09/14 0000) RPR: Nonreactive (09/14 0000)  HBsAg: Negative (09/14 0000)  HIV: Non-reactive (09/14 0000)  GBS: Positive/-- (03/05 0000)  NIPT low risk Essential panel negative 2021 HgbAA One hour GCT 113  Assessment/Plan: Reviewed c-section risks and benefits with  patient including bleeding, infection and possible damage to bowel and bladder. Plan TXA given poly and LGA baby. SHe would accept a transfusion if needed. h/o keloid so asking if can inject kenolog in incisionat end of case like last time--will order.    [name] 03/26/2023, 12:12 PM

## 2023-03-28 ENCOUNTER — Encounter (HOSPITAL_COMMUNITY): Payer: Self-pay | Admitting: Obstetrics and Gynecology

## 2023-03-28 ENCOUNTER — Other Ambulatory Visit (HOSPITAL_COMMUNITY)
Admission: RE | Admit: 2023-03-28 | Discharge: 2023-03-28 | Disposition: A | Discharge status: Home / Self Care | Payer: 59 | Source: Ambulatory Visit | Attending: Obstetrics and Gynecology | Admitting: Obstetrics and Gynecology

## 2023-03-28 DIAGNOSIS — Z01812 Encounter for preprocedural laboratory examination: Secondary | ICD-10-CM | POA: Insufficient documentation

## 2023-03-28 DIAGNOSIS — Z98891 History of uterine scar from previous surgery: Secondary | ICD-10-CM | POA: Insufficient documentation

## 2023-03-28 HISTORY — DX: Hypothyroidism, unspecified: E03.9

## 2023-03-28 LAB — CBC
HCT: 36.6 % (ref 36.0–46.0)
Hemoglobin: 11.9 g/dL — ABNORMAL LOW (ref 12.0–15.0)
MCH: 28.3 pg (ref 26.0–34.0)
MCHC: 32.5 g/dL (ref 30.0–36.0)
MCV: 86.9 fL (ref 80.0–100.0)
Platelets: 203 10*3/uL (ref 150–400)
RBC: 4.21 MIL/uL (ref 3.87–5.11)
RDW: 13.9 % (ref 11.5–15.5)
WBC: 7.3 10*3/uL (ref 4.0–10.5)
nRBC: 0 % (ref 0.0–0.2)

## 2023-03-28 LAB — OB RESULTS CONSOLE RPR: RPR: NONREACTIVE

## 2023-03-28 LAB — TYPE AND SCREEN
ABO/RH(D): O POS
Antibody Screen: NEGATIVE

## 2023-03-28 LAB — RPR: RPR Ser Ql: NONREACTIVE

## 2023-03-28 NOTE — Anesthesia Preprocedure Evaluation (Addendum)
Anesthesia Evaluation  Patient identified by MRN, date of birth, ID band Patient awake    Reviewed: Allergy & Precautions, H&P , NPO status , Patient's Chart, lab work & pertinent test results  Airway Mallampati: I       Dental no notable dental hx.    Pulmonary neg pulmonary ROS   Pulmonary exam normal        Cardiovascular negative cardio ROS Normal cardiovascular exam     Neuro/Psych negative neurological ROS  negative psych ROS   GI/Hepatic negative GI ROS, Neg liver ROS,,,  Endo/Other  Hypothyroidism    Renal/GU negative Renal ROS  negative genitourinary   Musculoskeletal negative musculoskeletal ROS (+)    Abdominal Normal abdominal exam  (+)   Peds  Hematology negative hematology ROS (+)   Anesthesia Other Findings   Reproductive/Obstetrics (+) Pregnancy                             Anesthesia Physical Anesthesia Plan  ASA: 2  Anesthesia Plan: Spinal   Post-op Pain Management:    Induction: Intravenous  PONV Risk Score and Plan: 3 and Ondansetron, Treatment may vary due to age or medical condition, Dexamethasone and Scopolamine patch - Pre-op  Airway Management Planned: Natural Airway, Nasal Cannula and Simple Face Mask  Additional Equipment: None  Intra-op Plan:   Post-operative Plan:   Informed Consent: I have reviewed the patients History and Physical, chart, labs and discussed the procedure including the risks, benefits and alternatives for the proposed anesthesia with the patient or authorized representative who has indicated his/her understanding and acceptance.       Plan Discussed with: CRNA  Anesthesia Plan Comments:         Anesthesia Quick Evaluation

## 2023-03-29 ENCOUNTER — Encounter (HOSPITAL_COMMUNITY): Payer: Self-pay | Admitting: Obstetrics and Gynecology

## 2023-03-29 ENCOUNTER — Other Ambulatory Visit: Payer: Self-pay

## 2023-03-29 ENCOUNTER — Inpatient Hospital Stay (HOSPITAL_COMMUNITY): Payer: 59 | Admitting: Anesthesiology

## 2023-03-29 ENCOUNTER — Inpatient Hospital Stay (HOSPITAL_COMMUNITY)
Admission: RE | Admit: 2023-03-29 | Discharge: 2023-03-30 | DRG: 788 | Disposition: A | Discharge status: Home / Self Care | Payer: 59 | Source: Ambulatory Visit | Attending: Obstetrics and Gynecology | Admitting: Obstetrics and Gynecology

## 2023-03-29 ENCOUNTER — Encounter (HOSPITAL_COMMUNITY)
Admission: RE | Disposition: A | Discharge status: Home / Self Care | Payer: Self-pay | Source: Ambulatory Visit | Attending: Obstetrics and Gynecology

## 2023-03-29 DIAGNOSIS — Z3A40 40 weeks gestation of pregnancy: Secondary | ICD-10-CM

## 2023-03-29 DIAGNOSIS — E039 Hypothyroidism, unspecified: Secondary | ICD-10-CM | POA: Diagnosis not present

## 2023-03-29 DIAGNOSIS — O3663X0 Maternal care for excessive fetal growth, third trimester, not applicable or unspecified: Secondary | ICD-10-CM | POA: Diagnosis not present

## 2023-03-29 DIAGNOSIS — O328XX0 Maternal care for other malpresentation of fetus, not applicable or unspecified: Secondary | ICD-10-CM | POA: Diagnosis not present

## 2023-03-29 DIAGNOSIS — Z01812 Encounter for preprocedural laboratory examination: Secondary | ICD-10-CM

## 2023-03-29 DIAGNOSIS — O34211 Maternal care for low transverse scar from previous cesarean delivery: Secondary | ICD-10-CM | POA: Diagnosis not present

## 2023-03-29 DIAGNOSIS — Z98891 History of uterine scar from previous surgery: Principal | ICD-10-CM

## 2023-03-29 DIAGNOSIS — O480 Post-term pregnancy: Secondary | ICD-10-CM | POA: Diagnosis present

## 2023-03-29 DIAGNOSIS — O99824 Streptococcus B carrier state complicating childbirth: Secondary | ICD-10-CM | POA: Diagnosis not present

## 2023-03-29 DIAGNOSIS — O99284 Endocrine, nutritional and metabolic diseases complicating childbirth: Secondary | ICD-10-CM | POA: Diagnosis not present

## 2023-03-29 SURGERY — Surgical Case
Anesthesia: Spinal

## 2023-03-29 MED ORDER — LACTATED RINGERS IV SOLN: INTRAVENOUS | Status: DC

## 2023-03-29 MED ORDER — TRANEXAMIC ACID-NACL 1000-0.7 MG/100ML-% IV SOLN
1000.0000 mg | Freq: Once | INTRAVENOUS | Status: AC
  Administered 2023-03-29: 1000 mg via INTRAVENOUS

## 2023-03-29 MED ORDER — SODIUM CHLORIDE (PF) 0.9 % IJ SOLN
INTRAMUSCULAR | Status: AC
  Filled 2023-03-29: qty 10

## 2023-03-29 MED ORDER — OXYTOCIN-SODIUM CHLORIDE 30-0.9 UT/500ML-% IV SOLN
INTRAVENOUS | Status: DC | PRN
  Administered 2023-03-29: 300 mL via INTRAVENOUS

## 2023-03-29 MED ORDER — DEXAMETHASONE SODIUM PHOSPHATE 4 MG/ML IJ SOLN
INTRAMUSCULAR | Status: AC
  Filled 2023-03-29: qty 1

## 2023-03-29 MED ORDER — GABAPENTIN 300 MG PO CAPS
300.0000 mg | ORAL_CAPSULE | ORAL | Status: AC
  Administered 2023-03-29: 300 mg via ORAL

## 2023-03-29 MED ORDER — PHENYLEPHRINE HCL-NACL 20-0.9 MG/250ML-% IV SOLN
INTRAVENOUS | Status: AC
  Filled 2023-03-29: qty 250

## 2023-03-29 MED ORDER — OXYCODONE HCL 5 MG PO TABS: 5.0000 mg | ORAL_TABLET | ORAL | Status: DC | PRN

## 2023-03-29 MED ORDER — LEVOTHYROXINE SODIUM 112 MCG PO TABS
112.0000 ug | ORAL_TABLET | ORAL | Status: DC
  Filled 2023-03-29: qty 1

## 2023-03-29 MED ORDER — OXYTOCIN-SODIUM CHLORIDE 30-0.9 UT/500ML-% IV SOLN
INTRAVENOUS | Status: AC
  Filled 2023-03-29: qty 500

## 2023-03-29 MED ORDER — PRENATAL MULTIVITAMIN CH
1.0000 | ORAL_TABLET | Freq: Every day | ORAL | Status: DC
  Administered 2023-03-29 – 2023-03-30 (×2): 1 via ORAL
  Filled 2023-03-29 (×2): qty 1

## 2023-03-29 MED ORDER — MENTHOL 3 MG MT LOZG: 1.0000 | LOZENGE | OROMUCOSAL | Status: DC | PRN

## 2023-03-29 MED ORDER — MORPHINE SULFATE (PF) 0.5 MG/ML IJ SOLN
INTRAMUSCULAR | Status: AC
  Filled 2023-03-29: qty 10

## 2023-03-29 MED ORDER — GENTAMICIN SULFATE 40 MG/ML IJ SOLN
5.0000 mg/kg | INTRAVENOUS | Status: AC
  Administered 2023-03-29: 410 mg via INTRAVENOUS
  Filled 2023-03-29: qty 10.25

## 2023-03-29 MED ORDER — SIMETHICONE 80 MG PO CHEW
80.0000 mg | CHEWABLE_TABLET | Freq: Three times a day (TID) | ORAL | Status: DC
  Administered 2023-03-29 – 2023-03-30 (×5): 80 mg via ORAL
  Filled 2023-03-29 (×5): qty 1

## 2023-03-29 MED ORDER — TRIAMCINOLONE ACETONIDE 40 MG/ML IJ SUSP
INTRAMUSCULAR | Status: AC
  Filled 2023-03-29: qty 1

## 2023-03-29 MED ORDER — DIBUCAINE (PERIANAL) 1 % EX OINT: 1.0000 | TOPICAL_OINTMENT | CUTANEOUS | Status: DC | PRN

## 2023-03-29 MED ORDER — ONDANSETRON HCL 4 MG/2ML IJ SOLN
INTRAMUSCULAR | Status: DC | PRN
  Administered 2023-03-29: 4 mg via INTRAVENOUS

## 2023-03-29 MED ORDER — POVIDONE-IODINE 10 % EX SWAB: 2.0000 | Freq: Once | CUTANEOUS | Status: DC

## 2023-03-29 MED ORDER — KETOROLAC TROMETHAMINE 30 MG/ML IJ SOLN
INTRAMUSCULAR | Status: DC | PRN
  Administered 2023-03-29: 30 mg via INTRAVENOUS

## 2023-03-29 MED ORDER — EPHEDRINE 5 MG/ML INJ
INTRAVENOUS | Status: AC
  Filled 2023-03-29: qty 5

## 2023-03-29 MED ORDER — ACETAMINOPHEN 10 MG/ML IV SOLN
INTRAVENOUS | Status: DC | PRN
  Administered 2023-03-29: 1000 mg via INTRAVENOUS

## 2023-03-29 MED ORDER — FENTANYL CITRATE (PF) 100 MCG/2ML IJ SOLN
INTRAMUSCULAR | Status: AC
  Filled 2023-03-29: qty 2

## 2023-03-29 MED ORDER — OXYTOCIN-SODIUM CHLORIDE 30-0.9 UT/500ML-% IV SOLN: 2.5000 [IU]/h | INTRAVENOUS | Status: DC

## 2023-03-29 MED ORDER — ONDANSETRON HCL 4 MG/2ML IJ SOLN
INTRAMUSCULAR | Status: AC
  Filled 2023-03-29: qty 2

## 2023-03-29 MED ORDER — STERILE WATER FOR IRRIGATION IR SOLN
Status: DC | PRN
  Administered 2023-03-29: 1000 mL

## 2023-03-29 MED ORDER — MORPHINE SULFATE (PF) 0.5 MG/ML IJ SOLN
INTRAMUSCULAR | Status: DC | PRN
  Administered 2023-03-29: 150 ug via INTRATHECAL

## 2023-03-29 MED ORDER — WITCH HAZEL-GLYCERIN EX PADS: 1.0000 | MEDICATED_PAD | CUTANEOUS | Status: DC | PRN

## 2023-03-29 MED ORDER — COCONUT OIL OIL: 1.0000 | TOPICAL_OIL | Status: DC | PRN

## 2023-03-29 MED ORDER — FENTANYL CITRATE (PF) 100 MCG/2ML IJ SOLN
INTRAMUSCULAR | Status: DC | PRN
  Administered 2023-03-29: 15 ug via INTRATHECAL

## 2023-03-29 MED ORDER — LEVOTHYROXINE SODIUM 112 MCG PO TABS
224.0000 ug | ORAL_TABLET | ORAL | Status: DC
  Administered 2023-03-30: 224 ug via ORAL
  Filled 2023-03-29 (×2): qty 2

## 2023-03-29 MED ORDER — TRANEXAMIC ACID-NACL 1000-0.7 MG/100ML-% IV SOLN
INTRAVENOUS | Status: AC
  Filled 2023-03-29: qty 100

## 2023-03-29 MED ORDER — KETOROLAC TROMETHAMINE 30 MG/ML IJ SOLN
INTRAMUSCULAR | Status: AC
  Filled 2023-03-29: qty 1

## 2023-03-29 MED ORDER — DIPHENHYDRAMINE HCL 25 MG PO CAPS: 25.0000 mg | ORAL_CAPSULE | Freq: Four times a day (QID) | ORAL | Status: DC | PRN

## 2023-03-29 MED ORDER — ZOLPIDEM TARTRATE 5 MG PO TABS: 5.0000 mg | ORAL_TABLET | Freq: Every evening | ORAL | Status: DC | PRN

## 2023-03-29 MED ORDER — SIMETHICONE 80 MG PO CHEW: 80.0000 mg | CHEWABLE_TABLET | ORAL | Status: DC | PRN

## 2023-03-29 MED ORDER — ACETAMINOPHEN 10 MG/ML IV SOLN
INTRAVENOUS | Status: AC
  Filled 2023-03-29: qty 100

## 2023-03-29 MED ORDER — TRIAMCINOLONE ACETONIDE 40 MG/ML IJ SUSP
40.0000 mg | Freq: Once | INTRAMUSCULAR | Status: AC
  Administered 2023-03-29: 40 mg via INTRAMUSCULAR

## 2023-03-29 MED ORDER — IBUPROFEN 600 MG PO TABS
600.0000 mg | ORAL_TABLET | Freq: Four times a day (QID) | ORAL | Status: DC
  Administered 2023-03-29 – 2023-03-30 (×5): 600 mg via ORAL
  Filled 2023-03-29 (×5): qty 1

## 2023-03-29 MED ORDER — ACETAMINOPHEN 500 MG PO TABS
1000.0000 mg | ORAL_TABLET | Freq: Four times a day (QID) | ORAL | Status: DC
  Administered 2023-03-29 – 2023-03-30 (×5): 1000 mg via ORAL
  Filled 2023-03-29 (×6): qty 2

## 2023-03-29 MED ORDER — EPHEDRINE SULFATE-NACL 50-0.9 MG/10ML-% IV SOSY
PREFILLED_SYRINGE | INTRAVENOUS | Status: DC | PRN
  Administered 2023-03-29 (×2): 5 mg via INTRAVENOUS

## 2023-03-29 MED ORDER — BUPIVACAINE IN DEXTROSE 0.75-8.25 % IT SOLN
INTRATHECAL | Status: DC | PRN
  Administered 2023-03-29: 1.6 mL via INTRATHECAL

## 2023-03-29 MED ORDER — PHENYLEPHRINE HCL-NACL 20-0.9 MG/250ML-% IV SOLN
INTRAVENOUS | Status: DC | PRN
  Administered 2023-03-29: 60 ug/min via INTRAVENOUS

## 2023-03-29 MED ORDER — CLINDAMYCIN PHOSPHATE 900 MG/50ML IV SOLN
900.0000 mg | INTRAVENOUS | Status: AC
  Administered 2023-03-29: 900 mg via INTRAVENOUS

## 2023-03-29 MED ORDER — TRIAMCINOLONE ACETONIDE 40 MG/ML IJ SUSP
INTRAMUSCULAR | Status: DC | PRN
  Administered 2023-03-29: 40 mg via INTRADERMAL

## 2023-03-29 MED ORDER — GABAPENTIN 300 MG PO CAPS
ORAL_CAPSULE | ORAL | Status: AC
  Filled 2023-03-29: qty 1

## 2023-03-29 MED ORDER — CLINDAMYCIN PHOSPHATE 900 MG/50ML IV SOLN
INTRAVENOUS | Status: AC
  Filled 2023-03-29: qty 50

## 2023-03-29 MED ORDER — SENNOSIDES-DOCUSATE SODIUM 8.6-50 MG PO TABS
2.0000 | ORAL_TABLET | Freq: Every day | ORAL | Status: DC
  Administered 2023-03-30: 2 via ORAL
  Filled 2023-03-29: qty 2

## 2023-03-29 SURGICAL SUPPLY — 36 items
APL PRP STRL LF DISP 70% ISPRP (MISCELLANEOUS) ×2
APL SKNCLS STERI-STRIP NONHPOA (GAUZE/BANDAGES/DRESSINGS) ×1
BENZOIN TINCTURE PRP APPL 2/3 (GAUZE/BANDAGES/DRESSINGS) IMPLANT
CHLORAPREP W/TINT 26 (MISCELLANEOUS) ×2 IMPLANT
CLAMP UMBILICAL CORD (MISCELLANEOUS) ×1 IMPLANT
CLOTH BEACON ORANGE TIMEOUT ST (SAFETY) ×1 IMPLANT
DRSG OPSITE POSTOP 4X10 (GAUZE/BANDAGES/DRESSINGS) ×1 IMPLANT
ELECT REM PT RETURN 9FT ADLT (ELECTROSURGICAL) ×1
ELECTRODE REM PT RTRN 9FT ADLT (ELECTROSURGICAL) ×1 IMPLANT
EXTRACTOR VACUUM BELL CUP MITY (SUCTIONS) IMPLANT
EXTRACTOR VACUUM KIWI (MISCELLANEOUS) IMPLANT
GAUZE SPONGE 4X4 12PLY STRL LF (GAUZE/BANDAGES/DRESSINGS) IMPLANT
GLOVE BIO SURGEON STRL SZ 6.5 (GLOVE) ×1 IMPLANT
GLOVE BIOGEL PI IND STRL 7.0 (GLOVE) ×1 IMPLANT
GOWN STRL REUS W/TWL LRG LVL3 (GOWN DISPOSABLE) ×2 IMPLANT
KIT ABG SYR 3ML LUER SLIP (SYRINGE) IMPLANT
NDL HYPO 25X5/8 SAFETYGLIDE (NEEDLE) IMPLANT
NEEDLE HYPO 25X5/8 SAFETYGLIDE (NEEDLE) IMPLANT
NS IRRIG 1000ML POUR BTL (IV SOLUTION) ×1 IMPLANT
PACK C SECTION WH (CUSTOM PROCEDURE TRAY) ×1 IMPLANT
PAD OB MATERNITY 4.3X12.25 (PERSONAL CARE ITEMS) ×1 IMPLANT
RTRCTR C-SECT PINK 25CM LRG (MISCELLANEOUS) ×1 IMPLANT
STRIP CLOSURE SKIN 1/2X4 (GAUZE/BANDAGES/DRESSINGS) IMPLANT
SUT CHROMIC 1 CTX 36 (SUTURE) ×2 IMPLANT
SUT PLAIN 0 NONE (SUTURE) IMPLANT
SUT PLAIN 2 0 XLH (SUTURE) ×1 IMPLANT
SUT VIC AB 0 CT1 27 (SUTURE) ×2
SUT VIC AB 0 CT1 27XBRD ANBCTR (SUTURE) ×2 IMPLANT
SUT VIC AB 2-0 CT1 27 (SUTURE) ×1
SUT VIC AB 2-0 CT1 TAPERPNT 27 (SUTURE) ×1 IMPLANT
SUT VIC AB 3-0 CT1 27 (SUTURE)
SUT VIC AB 3-0 CT1 TAPERPNT 27 (SUTURE) IMPLANT
SUT VIC AB 4-0 KS 27 (SUTURE) ×1 IMPLANT
TOWEL OR 17X24 6PK STRL BLUE (TOWEL DISPOSABLE) ×1 IMPLANT
TRAY FOLEY W/BAG SLVR 14FR LF (SET/KITS/TRAYS/PACK) ×1 IMPLANT
WATER STERILE IRR 1000ML POUR (IV SOLUTION) ×1 IMPLANT

## 2023-03-29 NOTE — Anesthesia Postprocedure Evaluation (Signed)
Anesthesia Post Note  Patient: Melody Gibbs  Procedure(s) Performed: [place]     Patient location during evaluation: Mother Baby Anesthesia Type: Spinal Level of consciousness: awake and alert Pain management: pain level controlled Vital Signs Assessment: post-procedure vital signs reviewed and stable Respiratory status: spontaneous breathing, nonlabored ventilation and respiratory function stable Cardiovascular status: stable Postop Assessment: no headache, no backache and epidural receding Anesthetic complications: no   No notable events documented.  Last Vitals:  Vitals:   03/29/23 1100 03/29/23 1423  BP:  112/67  Pulse:  70  Resp:  16  Temp: 36.6 C 36.5 C  SpO2: 98%     Last Pain:  Vitals:   03/29/23 1423  TempSrc: Oral  PainSc:    Pain Goal:                   Isom Kochan

## 2023-03-29 NOTE — Anesthesia Postprocedure Evaluation (Signed)
Anesthesia Post Note  Patient: Melody Gibbs  Procedure(s) Performed: [place]     Patient location during evaluation: PACU Anesthesia Type: Spinal Level of consciousness: awake Pain management: pain level controlled Vital Signs Assessment: post-procedure vital signs reviewed and stable Respiratory status: spontaneous breathing Cardiovascular status: stable Postop Assessment: no apparent nausea or vomiting Anesthetic complications: no  No notable events documented.  Last Vitals:  Vitals:   03/29/23 1000 03/29/23 1009  BP: 108/60 (!) 96/56  Pulse: 80 75  Resp: (!) 21 18  Temp: 36.7 C 36.8 C  SpO2: 97% 96%    Last Pain:  Vitals:   03/29/23 1009  TempSrc:   PainSc: 0-No pain                 Huston Foley

## 2023-03-29 NOTE — Op Note (Addendum)
Operative Note    Preoperative Diagnosis Term pregnancy at 40 1/7 weeks Prior c-section, declines trial of labor Suspected macrosomia Variable lie (Footling breech one week ago)   Postoperative Diagnosis Same  Procedure Repeat low transverse c-section with two layer closure of uterus Vacuum assisted delivery of head  Surgeon Melody Compton, MD  Anesthesia Spinal  Fluids: EBL 414mL UOP 127mL clear IVF 2253ml LR  Findings A viable female infant in the vertex presentation.  Apgars 9,9 Weight 9#10oz  Normal uterus ovaries and tubes  Specimen Placenta to L&D  Paratubal cyst to pathology  Procedure Note Patient was taken to the operating room where spinal anesthesia was obtained and found to be adequate by Allis clamp test. She was prepped and draped in the normal sterile fashion in the dorsal supine position with a leftward tilt. An appropriate time out was performed. A Pfannenstiel skin incision was then made through a pre-existing scar with the scalpel and carried through to the underlying layer of fascia by sharp dissection and Bovie cautery. The fascia was nicked in the midline and the incision was extended laterally with Mayo scissors. The inferior aspect of the incision was grasped Coker clamps and dissected off the underlying rectus muscles. In a similar fashion the superior aspect was dissected off the rectus muscles. Rectus muscles were separated in the midline and the peritoneal cavity entered bluntly. The peritoneal incision was then extended both superiorly and inferiorly with careful attention to avoid both bowel and bladder. The Alexis self-retaining wound retractor was then placed within the incision and the lower uterine segment exposed. The bladder flap was developed with Metzenbaum scissors and pushed away from the lower uterine segment. The lower uterine segment was then incised in a transverse fashion and the cavity itself entered bluntly. The incision was extended  bluntly. The infant's head was then lifted and delivered up to the incision but could not be expressed with fundal pressure.  The kiwi vacuum was applied x 2 with 2 pop-offs, so the bell cup vacuum then used.  The head still would not deliver so the rectus muscles were cut on the right side about 1cm to gain room and the head was then able to be delivered with one final application of the kiwi vacuum.  The remainder of the infant delivered and the nose and mouth bulb suctioned with the cord clamped and cut as well. The infant was handed off to the waiting pediatricians. The placenta was then spontaneously expressed from the uterus and the uterus cleared of all clots and debris with moist lap sponge. The uterine incision was then repaired in 2 layers the first layer was a running locked layer of 1-0 chromic and the second an imbricating layer of the same suture. The tubes and ovaries were inspected and the gutters cleared of all clots and debris. The uterine incision was inspected and found to be hemostatic. All instruments and sponges as well as the Alexis retractor were then removed from the abdomen. The defect on the rectus muscle was closed with 1-0 chromic. The rectus muscles and peritoneum were then reapproximated with a running suture of 2-0 Vicryl. The fascia was then closed with 0 Vicryl in a running fashion. Subcutaneous tissue was reapproximated with 3-0 plain in a running fashion. Kenalog was injected just under the incison.  (40mg  in 12ml) The skin was closed with a subcuticular stitch of 4-0 Vicryl on a Keith needle and then reinforced with benzoin and Steri-Strips. At the conclusion of the procedure  all instruments and sponge counts were correct. Patient was taken to the recovery room in good condition with her baby accompanying her skin to skin.   D/w patient circumcision and they desire--orders placed for consent.

## 2023-03-29 NOTE — Transfer of Care (Signed)
Immediate Anesthesia Transfer of Care Note  Patient: Melody Gibbs  Procedure(s) Performed: CESAREAN SECTION  Patient Location: PACU  Anesthesia Type:Spinal  Level of Consciousness: awake  Airway & Oxygen Therapy: Patient Spontanous Breathing  Post-op Assessment: Report given to RN and Post -op Vital signs reviewed and stable  Post vital signs: Reviewed and stable  Last Vitals:  Vitals Value Taken Time  BP 106/60 03/29/23 0904  Temp    Pulse 76 03/29/23 0906  Resp 16 03/29/23 0906  SpO2 99 % 03/29/23 0906  Vitals shown include unvalidated device data.  Last Pain:  Vitals:   03/29/23 0600  TempSrc: Oral  PainSc:          Complications: No notable events documented.

## 2023-03-29 NOTE — Anesthesia Postprocedure Evaluation (Signed)
Anesthesia Post Note  Patient: Melody Gibbs  Procedure(s) Performed: CESAREAN SECTION     Anesthesia Type: Spinal Anesthetic complications: no   No notable events documented.  Last Vitals:  Vitals:   03/29/23 1000 03/29/23 1009  BP: 108/60 (!) 96/56  Pulse: 80 75  Resp: (!) 21 18  Temp: 36.7 C 36.8 C  SpO2: 97% 96%    Last Pain:  Vitals:   03/29/23 1009  TempSrc:   PainSc: 0-No pain                 Huston Foley

## 2023-03-29 NOTE — Anesthesia Procedure Notes (Signed)
Spinal  Patient location during procedure: OR Start time: 03/29/2023 7:31 AM End time: 03/29/2023 7:34 AM Reason for block: surgical anesthesia Staffing Performed: anesthesiologist  Anesthesiologist: Lyn Hollingshead, MD Performed by: Lyn Hollingshead, MD Authorized by: Lyn Hollingshead, MD   Preanesthetic Checklist Completed: patient identified, IV checked, site marked, risks and benefits discussed, surgical consent, monitors and equipment checked, pre-op evaluation and timeout performed Spinal Block Patient position: sitting Prep: DuraPrep and site prepped and draped Patient monitoring: continuous pulse ox and blood pressure Approach: midline Location: L3-4 Injection technique: single-shot Needle Needle type: Pencan  Needle gauge: 24 G Needle length: 10 cm Needle insertion depth: 5 cm Assessment Sensory level: T4 Events: CSF return

## 2023-03-29 NOTE — Interval H&P Note (Signed)
History and Physical Interval Note:  03/29/2023 7:11 AM  Melody Gibbs  has presented today for surgery, with the diagnosis of repeat c-section.  The various methods of treatment have been discussed with the patient and family. After consideration of risks, benefits and other options for treatment, the patient has consented to  Procedure(s) with comments: CESAREAN SECTION (N/A) - request OB Fellow assist as a surgical intervention.  The patient's history has been reviewed, patient examined, no change in status, stable for surgery.  I have reviewed the patient's chart and labs.  Questions were answered to the patient's satisfaction.     [name]

## 2023-03-29 NOTE — Lactation Note (Signed)
This note was copied from a baby's chart. Lactation Consultation Note  Patient Name: Melody Gibbs [id number] Date: 03/29/2023 Age:36 hours Reason for consult: Initial assessment  P2, Baby unlatched and latched when LC was in room. Feed on demand with cues.  Goal 8-12+ times per day after first 24 hrs.  Place baby STS if not cueing.  Discussed cluster feeding and mother states she knows how to hand express. Provided lactation information sheet. Suggest calling for help as needed.    Maternal Data Has patient been taught Hand Expression?: Yes Does the patient have breastfeeding experience prior to this delivery?: Yes How long did the patient breastfeed?: 8 mos.  Feeding Mother's Current Feeding Choice: Breast Milk  LATCH Score Latch: Grasps breast easily, tongue down, lips flanged, rhythmical sucking.  Audible Swallowing: A few with stimulation  Type of Nipple: Everted at rest and after stimulation  Comfort (Breast/Nipple): Soft / non-tender  Hold (Positioning): Assistance needed to correctly position infant at breast and maintain latch.  LATCH Score: 8   Lactation Tools Discussed/Used    Interventions Interventions: Print production planner  Discharge    Consult Status Consult Status: Follow-up Date: 03/30/23 Follow-up type: In-patient    Vivianne Master [facility] 03/29/2023, 3:33 PM

## 2023-03-29 NOTE — Addendum Note (Signed)
Addendum  created 03/29/23 1452 by Ignacia Bayley, CRNA   Clinical Note Signed

## 2023-03-30 ENCOUNTER — Other Ambulatory Visit (HOSPITAL_COMMUNITY): Payer: Self-pay

## 2023-03-30 LAB — CBC
HCT: 31.3 % — ABNORMAL LOW (ref 36.0–46.0)
Hemoglobin: 10.7 g/dL — ABNORMAL LOW (ref 12.0–15.0)
MCH: 29.5 pg (ref 26.0–34.0)
MCHC: 34.2 g/dL (ref 30.0–36.0)
MCV: 86.2 fL (ref 80.0–100.0)
Platelets: 213 10*3/uL (ref 150–400)
RBC: 3.63 MIL/uL — ABNORMAL LOW (ref 3.87–5.11)
RDW: 13.8 % (ref 11.5–15.5)
WBC: 10.9 10*3/uL — ABNORMAL HIGH (ref 4.0–10.5)
nRBC: 0 % (ref 0.0–0.2)

## 2023-03-30 MED ORDER — SODIUM CHLORIDE 0.9% FLUSH: 3.0000 mL | INTRAVENOUS | Status: DC | PRN

## 2023-03-30 MED ORDER — IBUPROFEN 600 MG PO TABS: 600.0000 mg | ORAL_TABLET | Freq: Four times a day (QID) | ORAL | 0 refills | Status: DC

## 2023-03-30 MED ORDER — KETOROLAC TROMETHAMINE 30 MG/ML IJ SOLN: 30.0000 mg | Freq: Four times a day (QID) | INTRAMUSCULAR | Status: DC | PRN

## 2023-03-30 MED ORDER — NALOXONE HCL 4 MG/10ML IJ SOLN: 1.0000 ug/kg/h | INTRAVENOUS | Status: DC | PRN

## 2023-03-30 MED ORDER — ONDANSETRON HCL 4 MG/2ML IJ SOLN: 4.0000 mg | Freq: Three times a day (TID) | INTRAMUSCULAR | Status: DC | PRN

## 2023-03-30 MED ORDER — NALOXONE HCL 0.4 MG/ML IJ SOLN: 0.4000 mg | INTRAMUSCULAR | Status: DC | PRN

## 2023-03-30 MED ORDER — DIPHENHYDRAMINE HCL 25 MG PO CAPS: 25.0000 mg | ORAL_CAPSULE | ORAL | Status: DC | PRN

## 2023-03-30 MED ORDER — SCOPOLAMINE 1 MG/3DAYS TD PT72: 1.0000 | MEDICATED_PATCH | Freq: Once | TRANSDERMAL | Status: DC

## 2023-03-30 MED ORDER — DIPHENHYDRAMINE HCL 50 MG/ML IJ SOLN: 12.5000 mg | INTRAMUSCULAR | Status: DC | PRN

## 2023-03-30 MED ORDER — IBUPROFEN 600 MG PO TABS
600.0000 mg | ORAL_TABLET | Freq: Four times a day (QID) | ORAL | 0 refills | Status: DC
  Filled 2023-03-30: qty 30, 8d supply, fill #0

## 2023-03-30 NOTE — Discharge Summary (Signed)
Postpartum Discharge Summary  Date of Service updated 03/30/23     Patient Name: Melody Gibbs DOB: [birth year] MRN: [id number]  Date of admission: 03/29/2023 Delivery date:03/29/2023  Delivering provider: Paula Compton  Date of discharge: 03/30/2023  Admitting diagnosis: S/P repeat low transverse C-section [Z98.891] Intrauterine pregnancy: [redacted]w[redacted]d     Secondary diagnosis:  Principal Problem:   S/P repeat low transverse C-section  Additional problems: None    Discharge diagnosis: Term Pregnancy Delivered                                              Post partum procedures: n/a Augmentation: N/A Complications: None  Hospital course: Sceduled C/S   37 y.o. yo [id number] at [redacted]w[redacted]d was admitted to the hospital 03/29/2023 for scheduled cesarean section with the following indication:Elective Repeat.Delivery details are as follows:  Membrane Rupture Time/Date:  ,03/29/2023   Delivery Method:C-Section, Vacuum Assisted  Details of operation can be found in separate operative note.  Patient had a postpartum course complicated by n/a.  She is ambulating, tolerating a regular diet, passing flatus, and urinating well. Patient is discharged home in stable condition on  03/30/23 on POD#1 per patient request        Newborn Data: Birth date:03/29/2023  Birth time:8:07 AM  Gender:Female  Living status:Living  Apgars:9 ,9  Weight:4380 g       Physical exam  Vitals:   03/29/23 1523 03/29/23 1937 03/29/23 2325 03/30/23 0600  BP: 112/67 112/66 106/61 103/68  Pulse: 70 69 62 80  Resp: 16 16 16 18   Temp: 97.7 F (36.5 C) 98.3 F (36.8 C) 98 F (36.7 C) 97.9 F (36.6 C)  TempSrc: Oral Oral Oral Oral  SpO2:  97% 99% 98%  Weight:      Height:       General: alert, cooperative, and no distress Lochia: appropriate Uterine Fundus: firm Incision: Healing well with no significant drainage, Dressing is clean, dry, and intact DVT Evaluation: No evidence of DVT seen on physical exam. Labs: Lab Results   Component Value Date   WBC 10.9 (H) 03/30/2023   HGB 10.7 (L) 03/30/2023   HCT 31.3 (L) 03/30/2023   MCV 86.2 03/30/2023   PLT 213 03/30/2023      Latest Ref Rng & Units 02/24/2022    9:06 AM  CMP  Glucose 70 - 99 mg/dL 76   BUN 6 - 23 mg/dL 14   Creatinine 0.40 - 1.20 mg/dL 1.18   Sodium 135 - 145 mEq/L 138   Potassium 3.5 - 5.1 mEq/L 4.0   Chloride 96 - 112 mEq/L 100   CO2 19 - 32 mEq/L 30   Calcium 8.4 - 10.5 mg/dL 8.8   Total Protein 6.0 - 8.3 g/dL 7.4   Total Bilirubin 0.2 - 1.2 mg/dL 0.7   Alkaline Phos 39 - 117 U/L 80   AST 0 - 37 U/L 38   ALT 0 - 35 U/L 15    Edinburgh Score:    03/30/2023    9:11 AM  Edinburgh Postnatal Depression Scale Screening Tool  I have been able to laugh and see the funny side of things. 0  I have looked forward with enjoyment to things. 0  I have blamed myself unnecessarily when things went wrong. 0  I have been anxious or worried for no good reason. 0  I have  felt scared or panicky for no good reason. 0  Things have been getting on top of me. 0  I have been so unhappy that I have had difficulty sleeping. 0  I have felt sad or miserable. 0  I have been so unhappy that I have been crying. 0  The thought of harming myself has occurred to me. 0  Edinburgh Postnatal Depression Scale Total 0      After visit meds:  Allergies as of 03/30/2023       Reactions   Penicillins    As a child        Medication List     TAKE these medications    ibuprofen 600 MG tablet Commonly known as: ADVIL Take 1 tablet (600 mg total) by mouth every 6 (six) hours.   levothyroxine 112 MCG tablet Commonly known as: SYNTHROID Take 1 tablet by mouth 5 days out of the week. Increase to 2 tablets by mouth 2 days out of the week. What changed:  how much to take how to take this when to take this additional instructions   PRENATAL VITAMIN PO Take 1 tablet by mouth daily.         Discharge home in stable condition Infant Feeding:  Breast Infant Disposition:home with mother Discharge instruction: per After Visit Summary and Postpartum booklet. Activity: Advance as tolerated. Pelvic rest for 6 weeks.  Diet: routine diet Anticipated Birth Control: Unsure Postpartum Appointment:6 weeks Additional Postpartum F/U: Incision check 2 weeks Future Appointments: Future Appointments  Date Time Provider [place]  05/24/2023  9:40 AM Philemon Kingdom, MD [id number] None   Follow up Visit:  Follow-up Information     Paula Compton, MD Follow up in 2 week(s).   Specialty: Obstetrics and Gynecology Contact information: [address] [place] [address] [phone]                     03/30/2023 [facility] Lars Masson, MD

## 2023-03-30 NOTE — Progress Notes (Signed)
Patient is doing well.  She is tolerating PO, ambulating, voiding.  Pain is controlled.  Lochia is appropriate  She strongly desires discharge to home today.  Has not required any oxycodone.  Is ambulating well.  Reports poor sleep in hospital and knowledge on how to manage post op care at home  Vitals:   03/29/23 1523 03/29/23 1937 03/29/23 2325 03/30/23 0600  BP: 112/67 112/66 106/61 103/68  Pulse: 70 69 62 80  Resp: 16 16 16 18   Temp: 97.7 F (36.5 C) 98.3 F (36.8 C) 98 F (36.7 C) 97.9 F (36.6 C)  TempSrc: Oral Oral Oral Oral  SpO2:  97% 99% 98%  Weight:      Height:        NAD Abdomen:  soft, appropriate tenderness, incisions intact and without erythema or drainage ext:    Symmetric, trace edema bilaterally  Lab Results  Component Value Date   WBC 10.9 (H) 03/30/2023   HGB 10.7 (L) 03/30/2023   HCT 31.3 (L) 03/30/2023   MCV 86.2 03/30/2023   PLT 213 03/30/2023    --/--/O POS (04/01 0905)  A/P    37 y.o. [id number] POD #1 s/p schedule RCS Routine post op and postpartum care.   f baby is cleared by peds, PM discharge is not unreasonable following circ as she is meeting all goals for discharge and has not history of PP HTN with last delivery.  Plan 2 week f/u w Dr. Marvel Plan in office  Desires neonatal circumcision.  Discussed r/b/a of the procedure.  Reviewed that circumcision is an elective surgical procedure and not considered medically necessary.  Reviewed the risks of the procedure including the risk of infection, bleeding, damage to surrounding structures, including scrotum, shaft, urethra and head of penis, and an undesired cosmetic effect requiring additional procedures for revision.  Consent signed.

## 2023-03-31 LAB — SURGICAL PATHOLOGY

## 2023-04-04 ENCOUNTER — Telehealth (HOSPITAL_COMMUNITY): Payer: Self-pay

## 2023-04-04 NOTE — Telephone Encounter (Signed)
Preadmission testing 

## 2023-04-07 ENCOUNTER — Telehealth (HOSPITAL_COMMUNITY): Payer: Self-pay | Admitting: *Deleted

## 2023-04-07 NOTE — Telephone Encounter (Signed)
Patient voiced no questions or concerns regarding her health at this time. EPDS=0. Patient voiced no questions or concerns regarding infant at this time. RN reviewed ABCs of safe sleep. Patient verbalized understanding. Patient informed about hospital's virtual postpartum classes and support groups. Declined email information at this time. Deforest Hoyles, RN, 04/07/23, [phone]

## 2023-04-12 DIAGNOSIS — Z4889 Encounter for other specified surgical aftercare: Secondary | ICD-10-CM | POA: Diagnosis not present

## 2023-04-18 ENCOUNTER — Encounter: Payer: Self-pay | Admitting: Internal Medicine

## 2023-04-19 ENCOUNTER — Other Ambulatory Visit: Payer: Self-pay | Admitting: Internal Medicine

## 2023-04-19 DIAGNOSIS — E038 Other specified hypothyroidism: Secondary | ICD-10-CM

## 2023-04-20 ENCOUNTER — Other Ambulatory Visit (INDEPENDENT_AMBULATORY_CARE_PROVIDER_SITE_OTHER): Payer: 59

## 2023-04-20 DIAGNOSIS — E063 Autoimmune thyroiditis: Secondary | ICD-10-CM

## 2023-04-20 DIAGNOSIS — E038 Other specified hypothyroidism: Secondary | ICD-10-CM

## 2023-04-20 LAB — T4, FREE: Free T4: 1.31 ng/dL (ref 0.60–1.60)

## 2023-04-20 LAB — TSH: TSH: 0.08 u[IU]/mL — ABNORMAL LOW (ref 0.35–5.50)

## 2023-05-17 ENCOUNTER — Other Ambulatory Visit (HOSPITAL_COMMUNITY): Payer: Self-pay

## 2023-05-17 ENCOUNTER — Other Ambulatory Visit: Payer: Self-pay | Admitting: Internal Medicine

## 2023-05-17 DIAGNOSIS — Z86018 Personal history of other benign neoplasm: Secondary | ICD-10-CM | POA: Diagnosis not present

## 2023-05-17 DIAGNOSIS — L739 Follicular disorder, unspecified: Secondary | ICD-10-CM | POA: Diagnosis not present

## 2023-05-17 DIAGNOSIS — Z3009 Encounter for other general counseling and advice on contraception: Secondary | ICD-10-CM | POA: Diagnosis not present

## 2023-05-17 DIAGNOSIS — B078 Other viral warts: Secondary | ICD-10-CM | POA: Diagnosis not present

## 2023-05-17 DIAGNOSIS — D2271 Melanocytic nevi of right lower limb, including hip: Secondary | ICD-10-CM | POA: Diagnosis not present

## 2023-05-17 DIAGNOSIS — L578 Other skin changes due to chronic exposure to nonionizing radiation: Secondary | ICD-10-CM | POA: Diagnosis not present

## 2023-05-17 DIAGNOSIS — D2261 Melanocytic nevi of right upper limb, including shoulder: Secondary | ICD-10-CM | POA: Diagnosis not present

## 2023-05-17 DIAGNOSIS — D2262 Melanocytic nevi of left upper limb, including shoulder: Secondary | ICD-10-CM | POA: Diagnosis not present

## 2023-05-17 DIAGNOSIS — Z1332 Encounter for screening for maternal depression: Secondary | ICD-10-CM | POA: Diagnosis not present

## 2023-05-17 DIAGNOSIS — D2371 Other benign neoplasm of skin of right lower limb, including hip: Secondary | ICD-10-CM | POA: Diagnosis not present

## 2023-05-17 DIAGNOSIS — D225 Melanocytic nevi of trunk: Secondary | ICD-10-CM | POA: Diagnosis not present

## 2023-05-17 DIAGNOSIS — L821 Other seborrheic keratosis: Secondary | ICD-10-CM | POA: Diagnosis not present

## 2023-05-17 DIAGNOSIS — E038 Other specified hypothyroidism: Secondary | ICD-10-CM

## 2023-05-17 MED ORDER — TRIAMCINOLONE ACETONIDE 0.1 % EX OINT
1.0000 | TOPICAL_OINTMENT | Freq: Two times a day (BID) | CUTANEOUS | 0 refills | Status: DC
  Filled 2023-05-17: qty 60, 30d supply, fill #0

## 2023-05-24 ENCOUNTER — Other Ambulatory Visit: Payer: Self-pay

## 2023-06-06 ENCOUNTER — Other Ambulatory Visit (INDEPENDENT_AMBULATORY_CARE_PROVIDER_SITE_OTHER): Payer: 59

## 2023-06-06 DIAGNOSIS — E038 Other specified hypothyroidism: Secondary | ICD-10-CM

## 2023-06-06 DIAGNOSIS — E063 Autoimmune thyroiditis: Secondary | ICD-10-CM

## 2023-06-06 LAB — TSH: TSH: 0.01 u[IU]/mL — ABNORMAL LOW (ref 0.35–5.50)

## 2023-06-06 LAB — T4, FREE: Free T4: 1.51 ng/dL (ref 0.60–1.60)

## 2023-07-21 ENCOUNTER — Encounter: Payer: Self-pay | Admitting: Internal Medicine

## 2023-07-21 DIAGNOSIS — E038 Other specified hypothyroidism: Secondary | ICD-10-CM

## 2023-07-21 NOTE — Telephone Encounter (Signed)
Ok for patient to come in for labs? She had labs in June. (Free T4 and TSH). Please advise,

## 2023-07-26 ENCOUNTER — Other Ambulatory Visit: Payer: Self-pay | Admitting: Internal Medicine

## 2023-07-26 ENCOUNTER — Other Ambulatory Visit (HOSPITAL_COMMUNITY): Payer: Self-pay

## 2023-07-26 DIAGNOSIS — E038 Other specified hypothyroidism: Secondary | ICD-10-CM

## 2023-07-26 MED ORDER — LEVOTHYROXINE SODIUM 112 MCG PO TABS
ORAL_TABLET | ORAL | 3 refills | Status: DC
Start: 2023-07-26 — End: 2023-08-03
  Filled 2023-07-26: qty 90, 70d supply, fill #0

## 2023-08-02 ENCOUNTER — Other Ambulatory Visit (INDEPENDENT_AMBULATORY_CARE_PROVIDER_SITE_OTHER): Payer: 59

## 2023-08-02 DIAGNOSIS — E038 Other specified hypothyroidism: Secondary | ICD-10-CM

## 2023-08-02 DIAGNOSIS — E063 Autoimmune thyroiditis: Secondary | ICD-10-CM

## 2023-08-02 LAB — TSH: TSH: 0.02 u[IU]/mL — ABNORMAL LOW (ref 0.35–5.50)

## 2023-08-03 ENCOUNTER — Encounter: Payer: Self-pay | Admitting: Internal Medicine

## 2023-08-03 ENCOUNTER — Telehealth: Payer: Self-pay | Admitting: Internal Medicine

## 2023-08-03 DIAGNOSIS — E038 Other specified hypothyroidism: Secondary | ICD-10-CM

## 2023-08-03 MED ORDER — LEVOTHYROXINE SODIUM 112 MCG PO TABS: ORAL_TABLET | ORAL | Status: DC

## 2023-08-03 NOTE — Telephone Encounter (Signed)
LMTRC  JMiller,RMA 

## 2023-08-03 NOTE — Telephone Encounter (Signed)
I called and spoke with the patient she is currently taking 112 mcg 6 days a week.

## 2023-08-03 NOTE — Telephone Encounter (Signed)
The patient is on too much levothyroxine, can you please contact the patient and verify exact dose of levothyroxine that she is taking?   Let me know so I can adjust the dose  Thanks

## 2023-08-03 NOTE — Telephone Encounter (Signed)
LMTCB

## 2023-10-07 ENCOUNTER — Other Ambulatory Visit: Payer: Self-pay | Admitting: Internal Medicine

## 2023-10-07 ENCOUNTER — Other Ambulatory Visit: Payer: 59

## 2023-10-07 DIAGNOSIS — E063 Autoimmune thyroiditis: Secondary | ICD-10-CM

## 2023-10-07 LAB — T4, FREE: Free T4: 0.82 ng/dL (ref 0.60–1.60)

## 2023-10-07 LAB — TSH: TSH: 11.44 u[IU]/mL — ABNORMAL HIGH (ref 0.35–5.50)

## 2023-10-07 MED ORDER — LEVOTHYROXINE SODIUM 100 MCG PO TABS: ORAL_TABLET | ORAL | 1 refills | Status: DC

## 2023-10-09 ENCOUNTER — Encounter: Payer: Self-pay | Admitting: Internal Medicine

## 2023-10-09 DIAGNOSIS — E063 Autoimmune thyroiditis: Secondary | ICD-10-CM

## 2023-10-10 MED ORDER — LEVOTHYROXINE SODIUM 100 MCG PO TABS
ORAL_TABLET | ORAL | 1 refills | Status: DC
Start: 2023-10-10 — End: 2024-01-02

## 2023-11-29 ENCOUNTER — Telehealth: Payer: Self-pay

## 2023-11-29 ENCOUNTER — Other Ambulatory Visit: Payer: 59

## 2023-11-29 DIAGNOSIS — E063 Autoimmune thyroiditis: Secondary | ICD-10-CM

## 2023-11-29 NOTE — Telephone Encounter (Signed)
Orders Placed This Encounter   Procedures   • T4, free   • TSH

## 2023-11-30 LAB — T4, FREE: Free T4: 1.2 ng/dL (ref 0.8–1.8)

## 2023-11-30 LAB — TSH: TSH: 2.93 m[IU]/L

## 2024-01-01 ENCOUNTER — Other Ambulatory Visit: Payer: Self-pay | Admitting: Internal Medicine

## 2024-01-01 DIAGNOSIS — E063 Autoimmune thyroiditis: Secondary | ICD-10-CM

## 2024-01-11 ENCOUNTER — Other Ambulatory Visit: Payer: 59

## 2024-01-11 ENCOUNTER — Other Ambulatory Visit: Payer: Self-pay

## 2024-01-11 ENCOUNTER — Other Ambulatory Visit: Payer: Self-pay | Admitting: Internal Medicine

## 2024-01-11 DIAGNOSIS — E063 Autoimmune thyroiditis: Secondary | ICD-10-CM

## 2024-01-12 LAB — TSH: TSH: 2.07 m[IU]/L

## 2024-01-12 LAB — T4, FREE: Free T4: 1.2 ng/dL (ref 0.8–1.8)

## 2024-01-17 ENCOUNTER — Ambulatory Visit: Payer: 59 | Admitting: Internal Medicine

## 2024-01-17 NOTE — Progress Notes (Deleted)
Patient ID: Melody Gibbs, female   DOB: 04/01/1986, 38 y.o.   MRN: [id number]  HPI  Melody Gibbs is a 38 y.o.-year-old female, initially referred by her PCP, Dr. Beverely Low, returning for follow-up for Hashimoto's hypothyroidism.  Her step father, Zachery Conch, is also my patient.  Last visit 1 year and 1 month ago.  Interim history: She feels well, without complaints today. She gave birth by C-section in 03/2023 to a healthy baby.  She is feeling well after pregnancy.  Reviewed and addended history: Patient describes that in 01/2022 she had an upper respiratory infection.  She was very slow to recover from this and remains very fatigued.  She went and saw her PCP on 02/24/2022 and the TSH was checked at that time.  This returns very elevated.  She had a repeat TSH along with free T4 and free T3 next day and they were all abnormal, in the hypothyroid range.    She had a recent pregnancy (no TSH levels from during the pregnancy), during which she gained a little bit more than 40 pounds.  She gave birth on 07/30/2021. Her son was larger for age (9.5 lbs) but healthy.  She was able to lose a significant amount of weight fast afterwards.  She had fluctuations of mood and energy since she gave birth.  She initially thought that this was postpartum depression.  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  At our first visit she described that she felt tired, but could keep up with work and she also continues to do intense exercise 2x a week, previously more frequently.   After starting levothyroxine, and especially after increasing the dose, she felt much better.  In 07/2023, she was taking LT4 112 mcg 6/7 days.  Dose was decreased to 100 mcg daily.  She takes LT4 100 mcg daily: - no missed doses - fasting - with water - separated by >30 min from b'fast  - no calcium, iron, PPIs - + prenatal multivitamins at night  I reviewed pt's thyroid tests: Lab Results  Component Value Date   TSH 2.07  01/11/2024   TSH 2.93 11/29/2023   TSH 11.44 (H) 10/07/2023   TSH 0.02 (L) 08/02/2023   TSH 0.01 (L) 06/06/2023   TSH 0.08 (L) 04/20/2023   TSH 2.07 11/29/2022   TSH 2.45 10/04/2022   TSH 1.59 08/24/2022   TSH 3.83 07/27/2022   FREET4 1.2 01/11/2024   FREET4 1.2 11/29/2023   FREET4 0.82 10/07/2023   FREET4 1.51 06/06/2023   FREET4 1.31 04/20/2023   FREET4 0.86 11/29/2022   FREET4 0.93 10/04/2022   FREET4 1.04 08/24/2022   FREET4 1.27 07/27/2022   FREET4 1.12 06/01/2022   T3FREE 3.2 04/01/2022   T3FREE 2.0 (L) 02/25/2022   Antithyroid antibodies: Component     Latest Ref Rng 11/29/2022  Thyroglobulin Ab     < or = 1 IU/mL <1   Thyroperoxidase Ab SerPl-aCnc     <9 IU/mL 141 (H)    Component     Latest Ref Rng 04/01/2022  Thyroglobulin Ab     < or = 1 IU/mL 1   Thyroperoxidase Ab SerPl-aCnc     <9 IU/mL >900 (H)     In 2021, she had palpitations >> a TSH was checked at that time and it was normal.   Pt denies feeling nodules in neck, dysphagia/odynophagia, SOB with lying down, resolved hoarseness.  She has + FH of thyroid disorders: biological MGM with hypothyroidsm. No  FH of thyroid cancer. No FH of AI ds. No h/o radiation tx to head or neck. No recent use of iodine supplements.  ROS: Constitutional: + see HPI  Past Medical History:  Diagnosis Date   Abnormal TSH    5.9 in 2012   Hypothyroidism    Past Surgical History:  Procedure Laterality Date   CESAREAN SECTION N/A 07/30/2021   Procedure: CESAREAN SECTION;  Surgeon: Huel Cote, MD;  Location: [facility] ORS;  Service: Obstetrics;  Laterality: N/A;   CESAREAN SECTION N/A 03/29/2023   Procedure: CESAREAN SECTION;  Surgeon: Huel Cote, MD;  Location: [facility] ORS;  Service: Obstetrics;  Laterality: N/A;  request OB Fellow assist   DILATION AND CURETTAGE OF UTERUS     WISDOM TOOTH EXTRACTION     Social History   Socioeconomic History   Marital status: Married    Spouse name: Not on file   Number of  children: 1   Years of education: Not on file   Highest education level: Not on file  Occupational History   Occupation:     Comment: Nurse   Occupation: in [employer]  Tobacco Use   Smoking status: Never   Smokeless tobacco: Never  Vaping Use   Vaping status: Never Used  Substance and Sexual Activity   Alcohol use: Not Currently    Comment: occ   Drug use: No   Sexual activity: Not on file  Other Topics Concern   Not on file  Social History Narrative   ** Merged History Encounter **       Education:  BSN from [employer] major at [employer] DNP in nurse anesthesia 2019 Frequent exercise Enjoys photography Married 2019    Social Drivers of Health   Financial Resource Strain: Not on file  Food Insecurity: No Food Insecurity (03/30/2023)   Hunger Vital Sign    Worried About Running Out of Food in the Last Year: Never true    Ran Out of Food in the Last Year: Never true  Transportation Needs: No Transportation Needs (03/29/2023)   PRAPARE - Administrator, Civil Service (Medical): No    Lack of Transportation (Non-Medical): No  Physical Activity: Not on file  Stress: Not on file  Social Connections: Not on file  Intimate Partner Violence: Not At Risk (03/29/2023)   Humiliation, Afraid, Rape, and Kick questionnaire    Fear of Current or Ex-Partner: No    Emotionally Abused: No    Physically Abused: No    Sexually Abused: No   Current Outpatient Medications on File Prior to Visit  Medication Sig Dispense Refill   ibuprofen (ADVIL) 600 MG tablet Take 1 tablet (600 mg total) by mouth every 6 (six) hours. 30 tablet 0   levothyroxine (SYNTHROID) 100 MCG tablet TAKE 1 TABLET BY MOUTH DAILY BEFORE BREAKFAST 45 tablet 0   Prenatal Vit-Fe Fumarate-FA (PRENATAL VITAMIN PO) Take 1 tablet by mouth daily.     triamcinolone ointment (KENALOG) 0.1 % Apply 1 Application topically 2 (two) times daily for 14 days. 60 g 0   No current facility-administered  medications on file prior to visit.   Allergies  Allergen Reactions   Penicillins     As a child   Family History  Problem Relation Age of Onset   Hypertension Father    Cancer Maternal Grandmother    Hypertension Maternal Grandmother    Thyroid disease Maternal Grandmother    Hypertension Other    Hyperlipidemia  Other    Arthritis Other    Dementia Other    Colon cancer Neg Hx    Breast cancer Neg Hx    Diabetes Neg Hx    Heart disease Neg Hx     PE: There were no vitals taken for this visit. Wt Readings from Last 3 Encounters:  03/29/23 187 lb 9.6 oz (85.1 kg)  03/18/23 180 lb (81.6 kg)  12/10/22 168 lb 8 oz (76.4 kg)   Constitutional: normal weight, in NAD Eyes: EOMI, no exophthalmos ENT: + Symmetrically enlarged, nontender, nonnodular, thyroid, no cervical lymphadenopathy Cardiovascular: RRR, No MRG Respiratory: CTA B Musculoskeletal: no deformities Skin: no rashes Neurological: no tremor with outstretched hands  ASSESSMENT: 1.  Hashimoto's hypothyroidism  2.  Goiter  PLAN:  1. Patient with a history of a very high TSH, 134, in 02/2022.  This was repeated with approximately the same results.  Dr. Beverely Low recommended levothyroxine but the patient was initially reticent to start, but then started at a low dose and titrated up.  When I last saw her she was pregnant and she was taking 9 tablets of LT4 112 mcg daily during the pregnancy.  She gave birth to a healthy baby in 03/2023.  Afterwards, she was taking 6 LT4 112 mcg tablets a week.  On this dose, TSH was suppressed.  The LT4 dose was decreased to 100 mcg daily, which she is taking now. - we checked her labs few days ago in preparation for the visit and they were normal: Lab Results  Component Value Date   TSH 2.07 01/11/2024  - she continues on LT4 100 mcg daily - pt feels good on this dose. - we discussed about taking the thyroid hormone every day, with water, >30 minutes before breakfast, separated by >4  hours from acid reflux medications, calcium, iron, multivitamins. Pt. is taking it correctly. - no plans for pregnancy for now  2.  Goiter -No neck compression symptoms -No nodular masses felt on palpation -We discussed that this could be related to inflammation of the thyroid in the setting of Hashimoto's thyroiditis.  We discussed about the fluctuating nature of the condition.  At last visit, her thyroid antibodies were much improved, during the pregnancy. -Will continue to follow her expectantly for this  Carlus Pavlov, MD PhD [facility] Endocrinology

## 2024-01-31 ENCOUNTER — Ambulatory Visit (INDEPENDENT_AMBULATORY_CARE_PROVIDER_SITE_OTHER): Payer: 59 | Admitting: Internal Medicine

## 2024-01-31 ENCOUNTER — Encounter: Payer: Self-pay | Admitting: Internal Medicine

## 2024-01-31 VITALS — BP 110/60 | HR 69 | Ht 67.0 in | Wt 150.8 lb

## 2024-01-31 DIAGNOSIS — E063 Autoimmune thyroiditis: Secondary | ICD-10-CM

## 2024-01-31 MED ORDER — LEVOTHYROXINE SODIUM 100 MCG PO TABS: 100.0000 ug | ORAL_TABLET | Freq: Every day | ORAL | 3 refills | Status: DC

## 2024-01-31 NOTE — Progress Notes (Signed)
 Patient ID: Melody Gibbs, female   DOB: [birth year], 37 y.o.   MRN: [id number] This note was precharted 01/17/2024 HPI  Melody Gibbs is a 38 y.o.-year-old female, initially referred by her PCP, Dr. Mahlon, returning for follow-up for Hashimoto's hypothyroidism.  Her step father, Melody Gibbs, is also my patient.  Last visit 1 year and 1 month ago.  Interim history: She feels well, without complaints today. She gave birth by C-section in 03/2023 to a healthy baby.  She feels well after pregnancy.  She is still breast-feeding, but only pumping twice a day.  She and her husband may decide for pregnancy later in the year. She had the flu 3 weeks ago. This resolved. She continues to exercises consistently. Usually works 7 am-3 pm.  Reviewed and addended history: Patient describes that in 01/2022 she had an upper respiratory infection.  She was very slow to recover from this and remains very fatigued.  She went and saw her PCP on 02/24/2022 and the TSH was checked at that time.  This returns very elevated.  She had a repeat TSH along with free T4 and free T3 next day and they were all abnormal, in the hypothyroid range.    She had a recent pregnancy (no TSH levels from during the pregnancy), during which she gained a little bit more than 40 pounds.  She gave birth in 07/2021. Her son was larger for age (9.5 lbs) but healthy.  She was able to lose a significant amount of weight fast afterwards.  She had fluctuations of mood and energy since she gave birth.  She initially thought that this was postpartum depression.  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  At our first visit she described that she felt tired, but could keep up with work and she also continues to do intense exercise 2x a week, previously more frequently.   After starting levothyroxine , and especially after increasing the dose, she felt much better.  She gave birth to a healthy boy, weighing 10 pounds, 03/2023.  For this  pregnancy, she mentions that she also gained 40 pounds.  In 07/2023, she was taking LT4 112 mcg 6/7 days.  Dose was decreased to 100 mcg daily.  She takes LT4 100 mcg daily: - no missed doses - fasting - with water  - separated by >30 min from b'fast  - no calcium, iron, PPIs - stopped prenatal multivitamins at night 3 weeks ago  I reviewed pt's thyroid  tests: Lab Results  Component Value Date   TSH 2.07 01/11/2024   TSH 2.93 11/29/2023   TSH 11.44 (H) 10/07/2023   TSH 0.02 (L) 08/02/2023   TSH 0.01 (L) 06/06/2023   TSH 0.08 (L) 04/20/2023   TSH 2.07 11/29/2022   TSH 2.45 10/04/2022   TSH 1.59 08/24/2022   TSH 3.83 07/27/2022   FREET4 1.2 01/11/2024   FREET4 1.2 11/29/2023   FREET4 0.82 10/07/2023   FREET4 1.51 06/06/2023   FREET4 1.31 04/20/2023   FREET4 0.86 11/29/2022   FREET4 0.93 10/04/2022   FREET4 1.04 08/24/2022   FREET4 1.27 07/27/2022   FREET4 1.12 06/01/2022   T3FREE 3.2 04/01/2022   T3FREE 2.0 (L) 02/25/2022   Antithyroid antibodies: Component     Latest Ref Rng 11/29/2022  Thyroglobulin Ab     < or = 1 IU/mL <1   Thyroperoxidase Ab SerPl-aCnc     <9 IU/mL 141 (H)    Component     Latest Ref Rng 04/01/2022  Thyroglobulin  Ab     < or = 1 IU/mL 1   Thyroperoxidase Ab SerPl-aCnc     <9 IU/mL >900 (H)     In 2021, she had palpitations >> a TSH was checked at that time and it was normal.   Pt denies feeling nodules in neck, dysphagia/odynophagia, SOB with lying down, hoarseness.  She has + FH of thyroid  disorders: biological MGM with hypothyroidsm. No FH of thyroid  cancer. No FH of AI ds. No h/o radiation tx to head or neck. No recent use of iodine  supplements.  ROS: Constitutional: + see HPI  Past Medical History:  Diagnosis Date   Abnormal TSH    5.9 in 2012   Hypothyroidism    Past Surgical History:  Procedure Laterality Date   CESAREAN SECTION N/A 07/30/2021   Procedure: CESAREAN SECTION;  Surgeon: Estelle Service, MD;  Location: [facility] LD  ORS;  Service: Obstetrics;  Laterality: N/A;   CESAREAN SECTION N/A 03/29/2023   Procedure: CESAREAN SECTION;  Surgeon: Estelle Service, MD;  Location: [facility] ORS;  Service: Obstetrics;  Laterality: N/A;  request OB Fellow assist   DILATION AND CURETTAGE OF UTERUS     WISDOM TOOTH EXTRACTION     Social History   Socioeconomic History   Marital status: Married    Spouse name: Not on file   Number of children: 1   Years of education: Not on file   Highest education level: Not on file  Occupational History   Occupation: [place]    Comment: Nurse   Occupation: in [employer]  [employer]  Tobacco Use   Smoking status: Never   Smokeless tobacco: Never  Vaping Use   Vaping status: Never Used  Substance and Sexual Activity   Alcohol use: Not Currently    Comment: occ   Drug use: No   Sexual activity: Not on file  Other Topics Concern   Not on file  Social History Narrative   ** Merged History Encounter **       Education:  BSN from [employer] major at [employer] DNP in nurse anesthesia 2019 Frequent exercise Enjoys photography Married 2019    Social Drivers of Health   Financial Resource Strain: Not on file  Food Insecurity: No Food Insecurity (03/30/2023)   Hunger Vital Sign    Worried About Running Out of Food in the Last Year: Never true    Ran Out of Food in the Last Year: Never true  Transportation Needs: No Transportation Needs (03/29/2023)   PRAPARE - Administrator, Civil Service (Medical): No    Lack of Transportation (Non-Medical): No  Physical Activity: Not on file  Stress: Not on file  Social Connections: Not on file  Intimate Partner Violence: Not At Risk (03/29/2023)   Humiliation, Afraid, Rape, and Kick questionnaire    Fear of Current or Ex-Partner: No    Emotionally Abused: No    Physically Abused: No    Sexually Abused: No   Current Outpatient Medications on File Prior to Visit  Medication Sig Dispense Refill   ibuprofen  (ADVIL ) 600 MG tablet  Take 1 tablet (600 mg total) by mouth every 6 (six) hours. 30 tablet 0   levothyroxine  (SYNTHROID ) 100 MCG tablet TAKE 1 TABLET BY MOUTH DAILY BEFORE BREAKFAST 45 tablet 0   Prenatal Vit-Fe Fumarate-FA (PRENATAL VITAMIN PO) Take 1 tablet by mouth daily.     triamcinolone  ointment (KENALOG ) 0.1 % Apply 1 Application topically 2 (two) times daily for 14 days.  60 g 0   No current facility-administered medications on file prior to visit.   Allergies  Allergen Reactions   Penicillins     As a child   Family History  Problem Relation Age of Onset   Hypertension Father    Cancer Maternal Grandmother    Hypertension Maternal Grandmother    Thyroid  disease Maternal Grandmother    Hypertension Other    Hyperlipidemia Other    Arthritis Other    Dementia Other    Colon cancer Neg Hx    Breast cancer Neg Hx    Diabetes Neg Hx    Heart disease Neg Hx    PE: BP 110/60   Pulse 69   Ht 5' 7 (1.702 m)   Wt 150 lb 12.8 oz (68.4 kg)   SpO2 98%   BMI 23.62 kg/m  Wt Readings from Last 3 Encounters:  01/31/24 150 lb 12.8 oz (68.4 kg)  03/29/23 187 lb 9.6 oz (85.1 kg)  03/18/23 180 lb (81.6 kg)   Constitutional: normal weight, in NAD Eyes: EOMI, no exophthalmos ENT: + Symmetrically enlarged, nontender, nonnodular, thyroid , no cervical lymphadenopathy Cardiovascular: RRR, No MRG Respiratory: CTA B Musculoskeletal: no deformities Skin: no rashes Neurological: no tremor with outstretched hands  ASSESSMENT: 1.  Hashimoto's hypothyroidism  2.  Goiter  PLAN:  1. Patient with a history of a very high TSH, 134, in 02/2022.  This was repeated with approximately the same results.  Dr. Mahlon recommended levothyroxine  but the patient was initially reticent to start, but then started at a low dose and titrated up.  When I last saw her she was pregnant and she was taking 9 tablets of LT4 112 mcg daily during the pregnancy.  She gave birth to a healthy baby in 03/2023.  Afterwards, she was taking  6 LT4 112 mcg tablets a week.  On this dose, TSH was suppressed.  LT4 dose was decreased to 100 mcg daily, which she is taking now. - latest thyroid  labs reviewed with pt. >> normal: Lab Results  Component Value Date   TSH 2.07 01/11/2024  - she continues on LT4 100  mcg daily - pt feels good on this dose. - we discussed about taking the thyroid  hormone every day, with water , >30 minutes before breakfast, separated by >4 hours from acid reflux medications, calcium, iron, multivitamins. Pt. is taking it correctly. - No plans for pregnancy for now, but may decide for this later in the year.  I advised her to let me know and we need to recheck her thyroid  tests before trying to get pregnant.   -I refilled her LT4 for now -I will see her back in a year  2.  Goiter -No neck compression symptoms -No nodular masses felt on palpation -We discussed that this could be related to inflammation of the thyroid  in the setting of Hashimoto's thyroiditis.  We discussed about the fluctuating nature of the condition.  At last visit, her thyroid  antibodies were much improved, during the pregnancy. -We will continue to follow her expectantly for this  Lela Fendt, MD PhD [facility]

## 2024-01-31 NOTE — Patient Instructions (Addendum)
 Please continue Levothyroxine  100 mcg daily.  Take the thyroid  hormone every day, with water , at least 30 minutes before breakfast, separated by at least 4 hours from: - acid reflux medications - calcium - iron - multivitamins  Please schedule follow-up appointment in 1 year.

## 2024-05-18 ENCOUNTER — Ambulatory Visit: Payer: Self-pay | Admitting: Internal Medicine

## 2024-05-18 LAB — LAB REPORT - SCANNED
EGFR: 90
Free T4: 1.39 ng/dL
TSH: 1.72 (ref 0.41–5.90)

## 2024-06-06 ENCOUNTER — Encounter: Payer: Self-pay | Admitting: Obstetrics and Gynecology

## 2024-06-13 ENCOUNTER — Other Ambulatory Visit: Payer: Self-pay | Admitting: Obstetrics and Gynecology

## 2024-06-13 DIAGNOSIS — R928 Other abnormal and inconclusive findings on diagnostic imaging of breast: Secondary | ICD-10-CM

## 2024-06-14 ENCOUNTER — Ambulatory Visit
Admission: RE | Admit: 2024-06-14 | Discharge: 2024-06-14 | Discharge status: Home / Self Care | Source: Ambulatory Visit | Attending: Obstetrics and Gynecology | Admitting: Obstetrics and Gynecology

## 2024-06-14 ENCOUNTER — Other Ambulatory Visit: Payer: Self-pay | Admitting: Obstetrics and Gynecology

## 2024-06-14 DIAGNOSIS — R928 Other abnormal and inconclusive findings on diagnostic imaging of breast: Secondary | ICD-10-CM

## 2024-06-14 DIAGNOSIS — N6310 Unspecified lump in the right breast, unspecified quadrant: Secondary | ICD-10-CM

## 2024-08-23 ENCOUNTER — Encounter: Payer: Self-pay | Admitting: Internal Medicine

## 2024-09-18 ENCOUNTER — Encounter: Payer: Self-pay | Admitting: Family Medicine

## 2024-12-17 ENCOUNTER — Other Ambulatory Visit

## 2024-12-21 ENCOUNTER — Telehealth: Payer: Self-pay | Admitting: Internal Medicine

## 2024-12-21 ENCOUNTER — Encounter: Payer: Self-pay | Admitting: Internal Medicine

## 2024-12-21 DIAGNOSIS — E063 Autoimmune thyroiditis: Secondary | ICD-10-CM

## 2024-12-21 MED ORDER — LEVOTHYROXINE SODIUM 100 MCG PO TABS: 100.0000 ug | ORAL_TABLET | Freq: Every day | ORAL | 1 refills | Status: AC

## 2024-12-21 NOTE — Telephone Encounter (Signed)
 See other message

## 2024-12-21 NOTE — Telephone Encounter (Signed)
 MEDICATION: levothyroxine  (SYNTHROID ) 100 MCG tablet   PHARMACY:   [id number] [place], [place] - [address] AT [facility] [address] [facility] [address] [place] [address] Phone: [phone]  Fax: [phone]    HAS THE PATIENT CONTACTED THEIR PHARMACY?  Yes  LAST REFILL:  @@LASTREFILL @  IS THIS A 90 DAY SUPPLY : Yes  IS PATIENT OUT OF MEDICATION: No  IF NOT; HOW MUCH IS LEFT: Enough to last until Monday  LAST APPOINTMENT DATE: @2 /4/25  NEXT APPOINTMENT DATE:@2 /17/2026  DO WE HAVE YOUR PERMISSION TO LEAVE A DETAILED MESSAGE?: Yes  OTHER COMMENTS:   **Let patient know to contact pharmacy at the end of the day to make sure medication is ready. **  ** Please notify patient to allow 48-72 hours to process**  **Encourage patient to contact the pharmacy for refills or they can request refills through [facility]**

## 2024-12-21 NOTE — Telephone Encounter (Signed)
 Requested Prescriptions   Signed Prescriptions Disp Refills   levothyroxine (SYNTHROID) 100 MCG tablet 90 tablet 1    Sig: Take 1 tablet (100 mcg total) by mouth daily before breakfast.    Authorizing Provider: Carlus Pavlov    Ordering User: Pollie Meyer

## 2025-01-02 ENCOUNTER — Other Ambulatory Visit

## 2025-01-09 ENCOUNTER — Ambulatory Visit
Admission: RE | Admit: 2025-01-09 | Discharge: 2025-01-09 | Disposition: A | Discharge status: Home / Self Care | Source: Ambulatory Visit | Attending: Obstetrics and Gynecology | Admitting: Obstetrics and Gynecology

## 2025-01-09 ENCOUNTER — Other Ambulatory Visit: Payer: Self-pay | Admitting: Obstetrics and Gynecology

## 2025-01-09 DIAGNOSIS — N6310 Unspecified lump in the right breast, unspecified quadrant: Secondary | ICD-10-CM

## 2025-01-09 DIAGNOSIS — R928 Other abnormal and inconclusive findings on diagnostic imaging of breast: Secondary | ICD-10-CM

## 2025-02-05 ENCOUNTER — Ambulatory Visit: Payer: 59 | Admitting: Internal Medicine

## 2025-02-12 ENCOUNTER — Ambulatory Visit: Admitting: Internal Medicine

## 2025-03-26 DIAGNOSIS — O34219 Maternal care for unspecified type scar from previous cesarean delivery: Secondary | ICD-10-CM

## 2025-03-28 ENCOUNTER — Inpatient Hospital Stay (HOSPITAL_COMMUNITY): Admit: 2025-03-28 | Admitting: Obstetrics and Gynecology

## 2025-03-28 ENCOUNTER — Encounter (HOSPITAL_COMMUNITY): Payer: Self-pay

## 2025-03-28 DIAGNOSIS — O34219 Maternal care for unspecified type scar from previous cesarean delivery: Secondary | ICD-10-CM

## 2025-03-28 SURGERY — Surgical Case
Anesthesia: Spinal

## 2025-07-16 ENCOUNTER — Other Ambulatory Visit
